# Patient Record
Sex: Female | Born: 1980 | Race: White | Hispanic: No | Marital: Married | Smoking: Never smoker
Health system: Southern US, Community
[De-identification: ages and names within clinical notes are randomized; demographics above are authoritative.]

## PROBLEM LIST (undated history)

## (undated) DIAGNOSIS — B999 Unspecified infectious disease: Secondary | ICD-10-CM

## (undated) DIAGNOSIS — D698 Other specified hemorrhagic conditions: Secondary | ICD-10-CM

## (undated) DIAGNOSIS — L509 Urticaria, unspecified: Secondary | ICD-10-CM

## (undated) DIAGNOSIS — L817 Pigmented purpuric dermatosis: Secondary | ICD-10-CM

## (undated) HISTORY — DX: Pigmented purpuric dermatosis: L81.7

## (undated) HISTORY — DX: Other specified hemorrhagic conditions: D69.8

## (undated) HISTORY — DX: Urticaria, unspecified: L50.9

## (undated) HISTORY — DX: Unspecified infectious disease: B99.9

---
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
## 2011-12-01 DIAGNOSIS — D698 Other specified hemorrhagic conditions: Secondary | ICD-10-CM

## 2011-12-01 DIAGNOSIS — L509 Urticaria, unspecified: Secondary | ICD-10-CM

## 2011-12-01 HISTORY — DX: Other specified hemorrhagic conditions: D69.8

## 2011-12-01 HISTORY — DX: Urticaria, unspecified: L50.9

## 2012-06-30 DIAGNOSIS — L817 Pigmented purpuric dermatosis: Secondary | ICD-10-CM

## 2012-06-30 HISTORY — DX: Pigmented purpuric dermatosis: L81.7

## 2012-11-30 NOTE — L&D Delivery Note (Signed)
Delivery Note At 12:00 PM a viable female was delivered via Vaginal, Spontaneous Delivery (Presentation: Left Occiput Anterior).  APGAR: 9, 9; weight pending.   Placenta status: Intact, Spontaneous.  Cord: 3 vessels with the following complications: None.  Cord pH: not collected  Anesthesia: Local  Episiotomy: None Lacerations: Periurethral;Vaginal Suture Repair: 3.0 vicryl 4.0 Est. Blood Loss (mL): 350 mL  Mom to postpartum.  Baby to skin to skin immediately after delivery.  Routine pp orders. Breastfeeding Pt c/o discomforts d/t hemorrhoids - medications ordered   Reed Eifert 07/23/2013, 1:54 PM

## 2013-01-05 ENCOUNTER — Ambulatory Visit: Payer: BC Managed Care – PPO | Admitting: Obstetrics and Gynecology

## 2013-01-05 DIAGNOSIS — Z331 Pregnant state, incidental: Secondary | ICD-10-CM

## 2013-01-05 LAB — POCT URINALYSIS DIPSTICK
Bilirubin, UA: NEGATIVE
Ketones, UA: NEGATIVE

## 2013-01-05 NOTE — Progress Notes (Signed)
NOB interview.  Transferring from [facility] without records which were requested today. States EDC by U/S 07/30/13. States had STD cultures and Pap last week but no blood work. 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 Is taking additional folic acid. NOB W/U with JO 01/06/13.

## 2013-01-06 ENCOUNTER — Ambulatory Visit: Payer: BC Managed Care – PPO

## 2013-01-06 ENCOUNTER — Ambulatory Visit: Payer: BC Managed Care – PPO | Admitting: Certified Nurse Midwife

## 2013-01-06 VITALS — BP 98/58 | Ht 64.0 in | Wt 113.0 lb

## 2013-01-06 DIAGNOSIS — O26849 Uterine size-date discrepancy, unspecified trimester: Secondary | ICD-10-CM

## 2013-01-06 DIAGNOSIS — E7212 Methylenetetrahydrofolate reductase deficiency: Secondary | ICD-10-CM | POA: Insufficient documentation

## 2013-01-06 DIAGNOSIS — Z331 Pregnant state, incidental: Secondary | ICD-10-CM

## 2013-01-06 DIAGNOSIS — L817 Pigmented purpuric dermatosis: Secondary | ICD-10-CM

## 2013-01-06 DIAGNOSIS — Z1589 Genetic susceptibility to other disease: Secondary | ICD-10-CM | POA: Insufficient documentation

## 2013-01-06 LAB — PRENATAL PANEL VII
HIV: NONREACTIVE
Hemoglobin: 13.4 g/dL (ref 12.0–15.0)
Hepatitis B Surface Ag: NEGATIVE
Lymphs Abs: 1.9 10*3/uL (ref 0.7–4.0)
MCH: 31.8 pg (ref 26.0–34.0)
Monocytes Relative: 7 % (ref 3–12)
Neutro Abs: 7.3 10*3/uL (ref 1.7–7.7)
Neutrophils Relative %: 74 % (ref 43–77)
RBC: 4.22 MIL/uL (ref 3.87–5.11)
Rubella: 0.89 Index (ref <0.90)

## 2013-01-06 NOTE — Patient Instructions (Signed)
Exercise During Pregnancy It is possible to maintain a healthy exercise program throughout a pregnancy. However, it is important to discuss exercising with your caregiver, so that the two of you may develop an appropriate exercise program. It is important to remember that exercise during pregnancy should be use to maintain one's health and not to lose weight. Strenuous activities should be avoided as the may cause the baby to have difficulty obtaining proper amounts of oxygen. A proper pregnancy exercise plan has many benefits including preparing you for the physical challenges of childbirth by strengthening the muscles that help with childbirth, reducing common backaches, alleviating constipation, improving posture, elevating mood, and promoting better sleep. If possible, begin exercising regularly before you become pregnant and continue through the duration of the pregnancy. It is difficult for a woman to begin an exercise program later in a pregnancy due to enlargement of the uterus and breasts and a shift in the center of gravity. Pregnancy exercise programs should be aimed at improving the muscles of the heart, back, pelvis, and abdomen. GENERAL GUIDELINES  Every woman and pregnancy is different; thus, the level of exercise you can do depends on your health, the conditions of the pregnancy, and activity level before pregnancy. For women who were sedentary before pregnancy, walking is a good way to begin. Use caution while participation in sports during pregnancy, because your center of gravity changes and may affect your balance or that require rapid movements. Always make sure to drink plenty of fluids to avoid dehydration, which may decrease blood flow to your baby. Avoid any activity that has the potential for trauma to the abdomen. If possible try to avoid high-altitude activities, which can deprive you and your baby of oxygen; this may cause premature labor. Talk with your caregiver.  Performing a  proper warm up and cool down are very important. It is important to start slowly and build up to more demanding exercises. Toward the end of an exercise session, gradually slow your activity. Perhaps try and work back through the exercises in reverse order. Check your pulse during peak activity, and discuss with your caregiver an appropriate range of heart rate for activity. Slow down your activity if your heart starts beating faster than the target range recommended by your health care provider. Do not exceed a heart rate of 140 beats per minute. Exercise that is too strenuous may speed up the baby's heartbeat to a dangerous level. In general, if you are able to carry on a conversation comfortably while exercising, your heart rate is probably within the recommended limits. Check to make sure.  You should stop exercising and call your health care provider if you have any unusual symptoms, such as pain, uterine contractions, chest pain, bleeding or fluid leakage from the vagina, dizziness, or shortness of breath. Talk to your health caregiver if you have any questions.  Document Released: 11/16/2005 Document Revised: 02/08/2012 Document Reviewed: 02/28/2009 [facility] Patient Information 2013 [place], [place].

## 2013-01-06 NOTE — Progress Notes (Signed)
°   [name] [name] is being seen today for her first obstetrical visit at [redacted]w[redacted]d gestation by sure LMP.  At her NOB with Nestor Ramp FH was not c/w dates, the [place] which showed a discrepancy, changing her GA by 22 days giving an EDD of 07/30/13  She reports feeling well.  She had questions regarding the supplements she has been taking particularly Vit A, about her requirements for folic acid, and exercise in pregnancy.  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  The following portions of the patient's history were reviewed and updated as appropriate: allergies, current medications, past family history, past medical history, past social history, past surgical history and problem list.  Review of Systems Pertinent ROS is described in HPI   Objective:   BP 98/58   Ht 5\' 4"  (1.626 m)   Wt 113 lb (51.256 kg)   BMI 19.40 kg/m2   LMP 10/01/2012 Wt Readings from Last 1 Encounters:  01/06/13 113 lb (51.256 kg)   BMI: Body mass index is 19.40 kg/(m^2).  General: alert, cooperative and no distress HEENT: grossly normal  Thyroid: normal  Respiratory: clear to auscultation bilaterally Cardiovascular: regular rate and rhythm,  Breasts:  No dominant masses, nipples erect Gastrointestinal: soft, non-tender; no masses,  no organomegaly Extremities: extremities normal, no pain or edema Vaginal Bleeding: None  Pelvic deferred -- done at [facility], awaiting records  FHR:  160  bpm  [place] SIUP, anteverted uterus, posterior placenta GA [redacted]w[redacted]d   Assessment:    Pregnancy at  [redacted]w[redacted]d MTHFR mutation A1298C  Plan:   EDD changed to 07/08/13 after dating [place]   Prenatal panel reviewed and discussed with the patient:  yes  Pap smear collected:  Collected at [facility] GC/Chlamydia collected:  no Wet prep:  Not done Discussion of Genetic testing options: Desires 1st trimester screening Prenatal vitamins recommended  Awaiting prenatal records from [facility] of care: Next visit:  4 weeks for ROB  Other anticipated f/u:      Haroldine Laws, CNM, MSN

## 2013-01-06 NOTE — Progress Notes (Signed)
Pt is here today for her NOB work-up. Pt stated she has cultures and pap done last week at [facility]. Pt stated no issues today.

## 2013-01-10 ENCOUNTER — Other Ambulatory Visit: Payer: Self-pay | Admitting: Obstetrics and Gynecology

## 2013-01-10 ENCOUNTER — Telehealth: Payer: Self-pay | Admitting: Obstetrics and Gynecology

## 2013-01-10 DIAGNOSIS — Q999 Chromosomal abnormality, unspecified: Secondary | ICD-10-CM

## 2013-01-10 LAB — US OB COMP LESS 14 WKS

## 2013-01-10 NOTE — Telephone Encounter (Signed)
Message copied by Mason Jim on Tue Jan 10, 2013  9:49 AM ------      Message from: Osborn Coho      Created: Tue Jan 10, 2013  7:37 AM      Regarding: RE: NOB        I don't recall needing additional folic acid for MTHFR mutation but 4mg  qd would be fine and sufficient for neural tube defects.  She may need anticoag prophylaxis though.  I rec a referral to MFM for consultation.  I am unaware of her history but a cursory look does not list a h/o sab or clotting event.            To the nurses,      Please refer to MFM            Thank you,      AYR      ----- Message -----         From: Haroldine Laws, CNM         Sent: 01/06/2013   5:35 PM           To: Purcell Nails, MD      Subject: NOB                                                      Dr. Su Hilt,            I saw a NOB today with MTHFR mutation A1298C.  Just from what the pt said, I understand this effects how much folic acid she needs in her pregnancy, but she wasn't sure how much.            And is there anything else I need to consider?            Thank you!      Candise Bowens            FYI: her records from [facility] haven't arrived yet so there isn't much in her file yet.       ------

## 2013-01-10 NOTE — Telephone Encounter (Signed)
TC to pt. LM to return call. To inform of MFM referral.

## 2013-01-10 NOTE — Telephone Encounter (Signed)
TC to pt. Informed of MFM appt 01/17/13 at 11:00. Also informed may take total of 4 mg Folic Acid. Pt verbalizes comprehension.

## 2013-01-16 ENCOUNTER — Other Ambulatory Visit: Payer: Self-pay

## 2013-01-16 DIAGNOSIS — Z36 Encounter for antenatal screening of mother: Secondary | ICD-10-CM

## 2013-01-16 NOTE — Patient Instructions (Signed)
°

## 2013-01-17 ENCOUNTER — Ambulatory Visit (HOSPITAL_COMMUNITY)
Admission: RE | Admit: 2013-01-17 | Discharge: 2013-01-17 | Disposition: A | Discharge status: Home / Self Care | Payer: BC Managed Care – PPO | Source: Ambulatory Visit | Attending: Obstetrics and Gynecology | Admitting: Obstetrics and Gynecology

## 2013-01-17 ENCOUNTER — Ambulatory Visit: Payer: BC Managed Care – PPO

## 2013-01-17 ENCOUNTER — Other Ambulatory Visit: Payer: Self-pay | Admitting: Obstetrics and Gynecology

## 2013-01-17 ENCOUNTER — Encounter (HOSPITAL_COMMUNITY): Payer: Self-pay

## 2013-01-17 DIAGNOSIS — Z36 Encounter for antenatal screening of mother: Secondary | ICD-10-CM

## 2013-01-17 DIAGNOSIS — L989 Disorder of the skin and subcutaneous tissue, unspecified: Secondary | ICD-10-CM | POA: Insufficient documentation

## 2013-01-17 DIAGNOSIS — E7212 Methylenetetrahydrofolate reductase deficiency: Secondary | ICD-10-CM

## 2013-01-17 DIAGNOSIS — E7210 Disorders of sulfur-bearing amino-acid metabolism, unspecified: Secondary | ICD-10-CM | POA: Insufficient documentation

## 2013-01-17 DIAGNOSIS — O99891 Other specified diseases and conditions complicating pregnancy: Secondary | ICD-10-CM | POA: Insufficient documentation

## 2013-01-17 DIAGNOSIS — Z1589 Genetic susceptibility to other disease: Secondary | ICD-10-CM

## 2013-01-17 LAB — US OB COMP LESS 14 WKS

## 2013-01-17 NOTE — Consult Note (Signed)
Maternal Fetal Medicine Consultation  Requesting Provider(s): Dierdre Forth, MD  Reason for consultation: Heterozygous for MTHFR A1298C  HPI: [name] [name] is a 32 yo G1P0 currently at 46 4/7 weeks who was seen today due to MTHFH polymorphism (A1298C).  [name] reports that over the last 6-12 months, she has experienced frequent lower extremity skin rashes - thought to be a possible vasculitis.  Her lower extremities frequently erupted into reddish vascular plaques and resolved after several days.  She has been seen my multiple specialists (dermatology, rheumatology) both here and in [place] where she previously resided that so far have been unrevealing.  A recent skin biopsy showed possible "hives".  As part of her work up, she underwent she had testing for MTHFR polymorphisms that showed she was heterozygous for A1298C.    [name] denies any personal history of venous thromboembolism.  She reports that her half-sister had "blood clots" but could not give any further details - unsure whether or not she required blood thinners.  Since becoming pregnant, her skin lesions have improved.  Her pregnancy course thus far has been uncomplicated.  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  Maternal grandmother with dementia, breast cancer  Soc: denies tobacco, ETOH use during pregnancy, denies illicit drug use  Review of Systems: no vaginal bleeding or cramping/contractions, no LOF, no nausea/vomiting. All other systems  reviewed and are negative.   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  Since the lesions are felt to be due to a possible vasculitis / rheumatologic abnormality, it may be reasonable to check antiphospholipid antibodies (anticardiolipin, lupus anticoagulant and anti beta2 microglobulin).  Would also recommend a screening ultrasound at approximately 28-30 weeks for growth.  If growth is appropriate at that time, would not recommend additional testing.         3) MTHFR polymorphism (A1298C) - accumulating evidence seems to show no difference in risk for disease in people in the [place] with and without the C667T and A1298C polymorphisms. In fact, the A1298C does not appear to affect the gene function at all.  At present, hyperhomocystenemia and MTHFR screening are not recommended as part of the thrombophilia work up, particularly in patients with a history of venous thromboembolism.  With a normal diet, there is likely no benefit from additional folate or vit B6 supplementation.  Thank you for the opportunity to be a part of the care of [name] [name]. Please contact our office if we can be of further assistance.   I spent approximately 30 minutes with this patient with over 50% of time spent in face-to-face counseling.  Alpha Gula, MD Maternal Fetal Medicine

## 2013-01-18 NOTE — Addendum Note (Signed)
Encounter addended by: Ty Hilts, RN on: 01/18/2013  3:34 PM<BR>     Documentation filed: Charges VN

## 2013-02-02 ENCOUNTER — Encounter: Payer: BC Managed Care – PPO | Admitting: Certified Nurse Midwife

## 2013-02-03 ENCOUNTER — Ambulatory Visit: Payer: BC Managed Care – PPO | Admitting: Obstetrics and Gynecology

## 2013-02-03 ENCOUNTER — Encounter: Payer: Self-pay | Admitting: Obstetrics and Gynecology

## 2013-02-03 VITALS — BP 98/60 | Wt 118.0 lb

## 2013-02-03 DIAGNOSIS — Z87440 Personal history of urinary (tract) infections: Secondary | ICD-10-CM | POA: Insufficient documentation

## 2013-02-03 DIAGNOSIS — Z331 Pregnant state, incidental: Secondary | ICD-10-CM

## 2013-02-03 DIAGNOSIS — I776 Arteritis, unspecified: Secondary | ICD-10-CM | POA: Insufficient documentation

## 2013-02-03 LAB — POCT URINALYSIS DIPSTICK
Bilirubin, UA: NEGATIVE
Ketones, UA: NEGATIVE
Spec Grav, UA: 1.01
pH, UA: 8

## 2013-02-03 NOTE — Addendum Note (Signed)
Addended by: Lerry Liner D on: 02/03/2013 03:16 PM   Modules accepted: Orders

## 2013-02-03 NOTE — Progress Notes (Signed)
[redacted]w[redacted]d No complaints Reviewed NOB labs done here, those done at [facility], AND 1ST TRIMESTER SCREEN AFP today Chem 10 today shows 1+ leuks.  Will do C&S and monthly for recurrent UTIs.   Reviewed MFM visit with Dr. Claudean Severance who recommends: No rx for MTHFR mutatuion         ACLA, LAC, antiB2 microglobulin workup for vasculitis F/u 4 weeks for ROB and U/S

## 2013-02-04 LAB — CULTURE, OB URINE
Colony Count: NO GROWTH
Organism ID, Bacteria: NO GROWTH

## 2013-02-06 LAB — BETA 2 MICROGLOBULIN, SERUM: Beta-2 Microglobulin: 1.04 mg/L (ref 1.01–1.73)

## 2013-02-07 LAB — ALPHA FETOPROTEIN, MATERNAL: Curr Gest Age: 15 wks.days

## 2013-02-07 LAB — LUPUS ANTICOAGULANT PANEL
DRVVT: 26.2 secs (ref <42.9)
PTT Lupus Anticoagulant: 27.9 secs — ABNORMAL LOW (ref 28.0–43.0)

## 2013-02-07 LAB — CARDIOLIPIN ANTIBODY: Phospholipids: 227 mg/dL (ref 151–264)

## 2013-07-23 ENCOUNTER — Encounter (HOSPITAL_COMMUNITY): Payer: Self-pay | Admitting: *Deleted

## 2013-07-23 ENCOUNTER — Inpatient Hospital Stay (HOSPITAL_COMMUNITY)
Admission: AD | Admit: 2013-07-23 | Discharge: 2013-07-25 | DRG: 775 | Disposition: A | Discharge status: Home / Self Care | Payer: Medicaid Other | Source: Ambulatory Visit | Attending: Obstetrics and Gynecology | Admitting: Obstetrics and Gynecology

## 2013-07-23 DIAGNOSIS — IMO0001 Reserved for inherently not codable concepts without codable children: Secondary | ICD-10-CM

## 2013-07-23 DIAGNOSIS — D649 Anemia, unspecified: Secondary | ICD-10-CM | POA: Diagnosis not present

## 2013-07-23 DIAGNOSIS — E7210 Disorders of sulfur-bearing amino-acid metabolism, unspecified: Secondary | ICD-10-CM | POA: Diagnosis present

## 2013-07-23 DIAGNOSIS — I776 Arteritis, unspecified: Secondary | ICD-10-CM | POA: Diagnosis present

## 2013-07-23 DIAGNOSIS — O878 Other venous complications in the puerperium: Secondary | ICD-10-CM | POA: Diagnosis present

## 2013-07-23 DIAGNOSIS — K649 Unspecified hemorrhoids: Secondary | ICD-10-CM | POA: Diagnosis present

## 2013-07-23 MED ORDER — ONDANSETRON HCL 4 MG/2ML IJ SOLN: 4.0000 mg | INTRAMUSCULAR | Status: DC | PRN

## 2013-07-23 MED ORDER — BUTORPHANOL TARTRATE 1 MG/ML IJ SOLN: 1.0000 mg | INTRAMUSCULAR | Status: DC | PRN

## 2013-07-23 MED ORDER — BENZOCAINE-MENTHOL 20-0.5 % EX AERO
1.0000 "application " | INHALATION_SPRAY | CUTANEOUS | Status: DC | PRN
  Administered 2013-07-23: 1 via TOPICAL
  Filled 2013-07-23 (×2): qty 56

## 2013-07-23 MED ORDER — IBUPROFEN 600 MG PO TABS
600.0000 mg | ORAL_TABLET | Freq: Four times a day (QID) | ORAL | Status: DC
  Administered 2013-07-23 – 2013-07-25 (×8): 600 mg via ORAL
  Filled 2013-07-23 (×8): qty 1

## 2013-07-23 MED ORDER — DIPHENHYDRAMINE HCL 25 MG PO CAPS: 25.0000 mg | ORAL_CAPSULE | Freq: Four times a day (QID) | ORAL | Status: DC | PRN

## 2013-07-23 MED ORDER — ONDANSETRON HCL 4 MG/2ML IJ SOLN: 4.0000 mg | Freq: Four times a day (QID) | INTRAMUSCULAR | Status: DC | PRN

## 2013-07-23 MED ORDER — CITRIC ACID-SODIUM CITRATE 334-500 MG/5ML PO SOLN: 30.0000 mL | ORAL | Status: DC | PRN

## 2013-07-23 MED ORDER — ONDANSETRON HCL 4 MG PO TABS: 4.0000 mg | ORAL_TABLET | ORAL | Status: DC | PRN

## 2013-07-23 MED ORDER — OXYTOCIN BOLUS FROM INFUSION: 500.0000 mL | INTRAVENOUS | Status: DC

## 2013-07-23 MED ORDER — TETANUS-DIPHTH-ACELL PERTUSSIS 5-2.5-18.5 LF-MCG/0.5 IM SUSP: 0.5000 mL | Freq: Once | INTRAMUSCULAR | Status: DC

## 2013-07-23 MED ORDER — OXYTOCIN 10 UNIT/ML IJ SOLN
INTRAMUSCULAR | Status: AC
  Filled 2013-07-23: qty 1

## 2013-07-23 MED ORDER — DIBUCAINE 1 % RE OINT
1.0000 "application " | TOPICAL_OINTMENT | RECTAL | Status: DC | PRN
  Administered 2013-07-23: 1 via RECTAL
  Filled 2013-07-23: qty 28

## 2013-07-23 MED ORDER — ZOLPIDEM TARTRATE 5 MG PO TABS: 5.0000 mg | ORAL_TABLET | Freq: Every evening | ORAL | Status: DC | PRN

## 2013-07-23 MED ORDER — LIDOCAINE HCL (PF) 1 % IJ SOLN
30.0000 mL | INTRAMUSCULAR | Status: DC | PRN
  Filled 2013-07-23 (×2): qty 30

## 2013-07-23 MED ORDER — IBUPROFEN 600 MG PO TABS: 600.0000 mg | ORAL_TABLET | Freq: Four times a day (QID) | ORAL | Status: DC | PRN

## 2013-07-23 MED ORDER — OXYCODONE-ACETAMINOPHEN 5-325 MG PO TABS
1.0000 | ORAL_TABLET | ORAL | Status: DC | PRN
  Administered 2013-07-24 (×5): 1 via ORAL
  Administered 2013-07-25 (×2): 2 via ORAL
  Administered 2013-07-25: 1 via ORAL
  Filled 2013-07-23 (×4): qty 1
  Filled 2013-07-23: qty 2
  Filled 2013-07-23 (×4): qty 1

## 2013-07-23 MED ORDER — SENNOSIDES-DOCUSATE SODIUM 8.6-50 MG PO TABS
2.0000 | ORAL_TABLET | Freq: Every day | ORAL | Status: DC
  Administered 2013-07-24: 2 via ORAL

## 2013-07-23 MED ORDER — OXYCODONE-ACETAMINOPHEN 5-325 MG PO TABS: 1.0000 | ORAL_TABLET | ORAL | Status: DC | PRN

## 2013-07-23 MED ORDER — LANOLIN HYDROUS EX OINT: TOPICAL_OINTMENT | CUTANEOUS | Status: DC | PRN

## 2013-07-23 MED ORDER — SIMETHICONE 80 MG PO CHEW: 80.0000 mg | CHEWABLE_TABLET | ORAL | Status: DC | PRN

## 2013-07-23 MED ORDER — LACTATED RINGERS IV SOLN: 500.0000 mL | INTRAVENOUS | Status: DC | PRN

## 2013-07-23 MED ORDER — OXYTOCIN 40 UNITS IN LACTATED RINGERS INFUSION - SIMPLE MED: 62.5000 mL/h | INTRAVENOUS | Status: DC

## 2013-07-23 MED ORDER — FLEET ENEMA 7-19 GM/118ML RE ENEM: 1.0000 | ENEMA | RECTAL | Status: DC | PRN

## 2013-07-23 MED ORDER — WITCH HAZEL-GLYCERIN EX PADS
1.0000 "application " | MEDICATED_PAD | CUTANEOUS | Status: DC | PRN
  Administered 2013-07-23: 1 via TOPICAL

## 2013-07-23 MED ORDER — PRENATAL MULTIVITAMIN CH
1.0000 | ORAL_TABLET | Freq: Every day | ORAL | Status: DC
  Administered 2013-07-24 – 2013-07-25 (×2): 1 via ORAL
  Filled 2013-07-23 (×2): qty 1

## 2013-07-23 MED ORDER — ACETAMINOPHEN 325 MG PO TABS: 650.0000 mg | ORAL_TABLET | ORAL | Status: DC | PRN

## 2013-07-23 NOTE — Progress Notes (Signed)
°  Subjective: CNM introducted to pt.  Pt in water, coping well with UCs.  Objective: BP 128/85   Pulse 91   Temp(Src) 97.9 F (36.6 C) (Oral)   Resp 20   Ht 5\' 2"  (1.575 m)   Wt 131 lb (59.421 kg)   BMI 23.95 kg/m2   SpO2 100%   LMP 10/01/2012      FHT:  FHR: 145 bpm, variability: moderate,  accelerations:  Present,  decelerations:  Absent UC:   regular, every 3 minutes SVE:   Deferred  Assessment / Plan:  Labor: Active labor Preeclampsia: No s/s Fetal Wellbeing: IA  Pain Control: Water birth, breathing and relaxation I/D: GBS neg; Intact Anticipated MOD: SVD   Annalicia Renfrew 07/23/2013, 8:18 AM

## 2013-07-23 NOTE — Progress Notes (Signed)
°  Subjective: Pt in tub having to work a little harder to manage UCs..  Husband, Doula, and photographer at [facility].    Objective: BP 128/85   Pulse 91   Temp(Src) 97.9 F (36.6 C) (Oral)   Resp 20   Ht 5\' 2"  (1.575 m)   Wt 131 lb (59.421 kg)   BMI 23.95 kg/m2   SpO2 100%   LMP 10/01/2012      FHT: IA: BL 155 with moderate variability  UC:   regular, every 2 minutes  SVE:   Dilation: 9 Effacement (%): 100 Station: +2 Exam by:: m wilkins rnc  Assessment / Plan:  Labor: Active labor  Preeclampsia: No s/s  Fetal Wellbeing: IA  Pain Control: Water birth, breathing and relaxation  I/D: GBS neg; SROM @ 1000 Anticipated MOD: SVD   Kimbria Camposano 07/23/2013, 10:11 AM

## 2013-07-23 NOTE — MAU Note (Signed)
Contractions that started around 11:30pm

## 2013-07-23 NOTE — Progress Notes (Signed)
°  Subjective: Pt in bathroom and would like to be checked.  Back to water after check.  Pt managing well through UCs with breathing and relaxation.  Doula, Environmental manager, and husband at [facility].  Objective: BP 128/85   Pulse 91   Temp(Src) 97.9 F (36.6 C) (Oral)   Resp 20   Ht 5\' 2"  (1.575 m)   Wt 131 lb (59.421 kg)   BMI 23.95 kg/m2   SpO2 100%   LMP 10/01/2012      FHT:  FHR: 150 bpm, variability: moderate,  accelerations:  Present,  decelerations:  Absent UC:   regular, every 3 minutes  SVE:   Dilation: 8 Effacement (%): 100 Station: +1 Exam by:: Yeilyn Gent  Assessment / Plan:  Labor: Active labor  Preeclampsia: No s/s  Fetal Wellbeing: IA  Pain Control: Water birth, breathing and relaxation  I/D: GBS neg; Intact  Anticipated MOD: SVD   [name] [name] 07/23/2013, 8:27 AM

## 2013-07-23 NOTE — H&P (Signed)
[name] [name] is a 32 y.o. female presenting at 10w 2d gesttation for onset of regular painful contractions with increased intensity and frequency since 12:00 AM 07/23/13.  Denies ROM or bldg.  Reports active fetus.  Unable to walk to talk through contractions.  Pt desires waterbirth and has taken the appropriate classes.  Pt is accompanied by her husband and doula.     History History of Present Pregnancy:  Pt entered care in the 1st trimester at [facility].  1st trimester screening was negative.  Pt transferred care to [facility] at 13w 5d.  Pt was evaluated by MFM due to her history of vasculitis/Schambergs Dx/MTHFR Heterozygous A1298C.  No treatment was recommended however it was recommended pt have a lupus anticoagulant, Beta 2 glycoprotein and anticardiolipin antibodies drawn.  The lupus anticoagulant was negative as was the Beta 2 glycoprotein.  No record of anticardiolipin antibody results available in the prenatal record.  Ultrasound for anatomy was performed at 18w 4d with fetal growth at the 52nd%ile.  At that time a quad screen was also drawn inadvertently.  The quad screen returned with increased risk of Trisomy 21 of 1:51, otherwise no increased risk for NTD or Trisomy 18.  Pt declined NIPT.  Due to abnormal quad screen, repeat ultrasound obtained at 28wks for fetal growth with EFW at the 49th% and normal amniotic fluid volumes.  Cervical length was also normal at that time.  One hour GTT returned with normal results at 28wks.  Repeat ultrasound was obtained at 31w 5d for growth with EFW at the 95th%ile and normal AFI.  Due to suspected LGA fetus at previous ultrasound, repeat ultrasound was obtained at 36w 5d with EFW at the 73.7rd%ile/7# 1oz and normal amniotic fluid volume.  The remainder of the pt's prenatal course has been uneventful.  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 Social History:  reports that she has never smoked. She has never used smokeless tobacco. She reports that she drinks about 0.5 ounces of alcohol per week. She reports that she does not use illicit drugs.   Prenatal Transfer Tool  Maternal Diabetes: No Genetic Screening: Abnormal:  Results: Elevated risk of Trisomy 21.  Had neg 1st trimester screen prior to quad. Maternal Ultrasounds/Referrals: Normal Fetal Ultrasounds or other Referrals:  Referred to Materal Fetal Medicine due to mat hx vasculitis/Schamberg Dx Maternal Substance Abuse:  No Significant Maternal Medications:  None Significant Maternal Lab Results:  Lab values include: Other: Quad screen with inc risk Tri 21 1:51 Other Comments:  None  Review of Systems  Constitutional: Negative.   HENT: Negative.   Eyes: Negative.   Respiratory: Negative.   Cardiovascular: Negative.   Gastrointestinal: Negative.   Genitourinary: Negative.  Musculoskeletal: Negative.   Skin: Negative.   Neurological: Negative.   Endo/Heme/Allergies: Negative.   Psychiatric/Behavioral: Negative.     Dilation: 6.5 Effacement (%): 100 Station: 0 Exam by:: Conni Elliot CNM Blood pressure 119/83, pulse 90, temperature 98.5 F (36.9 C), temperature source Oral, resp. rate 20, height 5\' 2"  (1.575 m), weight 59.421 kg (131 lb), last menstrual period 10/01/2012,  SpO2 100.00%. Maternal Exam:  Uterine Assessment: Contraction strength is firm.  Contraction frequency is regular.   Abdomen: Fundal height is 38.   Estimated fetal weight is 7#.   Fetal presentation: vertex  Introitus: Normal vulva. Normal vagina.  Ferning test: not done.  Nitrazine test: not done. Amniotic fluid character: not assessed.  Pelvis: adequate for delivery.   Cervix: Cervix evaluated by digital exam.     Fetal Exam Fetal Monitor Review: Mode: ultrasound.   Baseline rate: 140.  Variability: moderate (6-25 bpm).   Pattern: accelerations present and no decelerations.    Fetal State Assessment: Category I - tracings are normal.     Physical Exam  Constitutional: She is oriented to person, place, and time. She appears well-developed and well-nourished.  HENT:  Head: Normocephalic and atraumatic.  Right Ear: External ear normal.  Left Ear: External ear normal.  Nose: Nose normal.  Eyes: Conjunctivae are normal. Pupils are equal, round, and reactive to light.  Neck: Normal range of motion. Neck supple.  Cardiovascular: Normal rate, regular rhythm, normal heart sounds and intact distal pulses.   Respiratory: Effort normal and breath sounds normal.  GI: Soft. Bowel sounds are normal. There is no tenderness. There is no rebound and no guarding.  Genitourinary: Vagina normal and uterus normal.  Musculoskeletal: Normal range of motion.  Neurological: She is alert and oriented to person, place, and time. She has normal reflexes.  Skin: Skin is warm and dry.  Psychiatric: She has a normal mood and affect. Her behavior is normal.    Prenatal labs: ABO, Rh: O/POS/-- (02/06 1421) Antibody: NEG (02/06 1421) Rubella: 0.89 (02/06 1421) RPR: NON REAC (02/06 1421)  HBsAg: NEGATIVE (02/06 1421)  HIV: NON REACTIVE (02/06 1421)  GBS: Negative (02/06 0000)   Assessment/Plan: IUP at 39w 2d Active labor Hx vasculitis Inc risk Trisomy 21 on quad screen   Admit to birthing  suites per consult with Dr. Dion Body. Routine admission orders. Pt declines IV access at this time unless absolutely needed. Pt desires minimal intervention and waterbirth.  RBA waterbirth d/w pt and consent signed.  D/W pt OK for intermittent fetal monitoring at the present time.    Loreal Schuessler O. 07/23/2013, 5:29 AM

## 2013-07-24 DIAGNOSIS — D649 Anemia, unspecified: Secondary | ICD-10-CM | POA: Diagnosis not present

## 2013-07-24 LAB — CBC
HCT: 35.2 % — ABNORMAL LOW (ref 36.0–46.0)
MCV: 91.9 fL (ref 78.0–100.0)
Platelets: 164 10*3/uL (ref 150–400)
RBC: 3.83 MIL/uL — ABNORMAL LOW (ref 3.87–5.11)
RDW: 14.2 % (ref 11.5–15.5)
WBC: 14.3 10*3/uL — ABNORMAL HIGH (ref 4.0–10.5)

## 2013-07-24 MED ORDER — HYDROCORTISONE ACE-PRAMOXINE 1-1 % RE FOAM
1.0000 | Freq: Two times a day (BID) | RECTAL | Status: DC
  Administered 2013-07-24 – 2013-07-25 (×2): 1 via RECTAL
  Filled 2013-07-24 (×2): qty 10

## 2013-07-24 NOTE — Lactation Note (Signed)
This note was copied from the chart of Girl Sayge Brienza. Lactation Consultation Note:Follow up visit with mom. She reports that baby has been sleepy and would not latch. Baby waking assisted mom with putting the baby to the breast. Baby fussy and would only take a few sucks. Used #20 NS and baby continues to fuss. Dad holding baby while mom going to bathroom. DEBP set up with instructions for use and cleaning.  Patient Name: Girl Marsella Suman [id number] Date: 07/24/2013 Reason for consult: Follow-up assessment   Maternal Data    Feeding Feeding Type: Breast Milk Length of feed: 2 min  LATCH Score/Interventions Latch: Too sleepy or reluctant, no latch achieved, no sucking elicited.  Audible Swallowing: None  Type of Nipple: Flat  Comfort (Breast/Nipple): Soft / non-tender     Hold (Positioning): Assistance needed to correctly position infant at breast and maintain latch. Intervention(s): Breastfeeding basics reviewed;Support Pillows;Position options;Skin to skin  LATCH Score: 4  Lactation Tools Discussed/Used     Consult Status Consult Status: Follow-up Date: 07/24/13 Follow-up type: In-patient    Pamelia Hoit 07/24/2013, 12:02 PM

## 2013-07-24 NOTE — Lactation Note (Signed)
This note was copied from the chart of [name] [name]. Lactation Consultation Note: assisted mom with latch with NS. Baby nursed for 10 minutes Colostrum noted in NS. Encouraged to pump after nursing to promote a good milk supply. No questions at present. To call prn  Patient Name: [name] [name] [id number] Date: 07/24/2013 Reason for consult: Follow-up assessment   Maternal Data    Feeding Feeding Type: Breast Milk Length of feed: 10 min  LATCH Score/Interventions Latch: Grasps breast easily, tongue down, lips flanged, rhythmical sucking. (with NS)  Audible Swallowing: None  Type of Nipple: Flat  Comfort (Breast/Nipple): Soft / non-tender     Hold (Positioning): Assistance needed to correctly position infant at breast and maintain latch. Intervention(s): Breastfeeding basics reviewed;Support Pillows;Position options;Skin to skin  LATCH Score: 6  Lactation Tools Discussed/Used Tools: Nipple Shields Nipple shield size: 20 Breast pump type: Double-Electric Breast Pump Pump Review: Setup, frequency, and cleaning Initiated by:: DW Date initiated:: 07/24/13   Consult Status Consult Status: Follow-up Date: 07/25/13 Follow-up type: In-patient    Pamelia Hoit 07/24/2013, 12:37 PM

## 2013-07-24 NOTE — Progress Notes (Signed)
Post Partum Day 1: S/P SVD with a 2nd degree laceration (Water Birth)  Subjective: Patient up ad lib, denies syncope or dizziness. Feeding:  Breastfeeding Contraceptive plan:   Unknown at this time  Objective: Blood pressure 94/59, pulse 77, temperature 98 F (36.7 C), temperature source Oral, resp. rate 20, height 5\' 2"  (1.575 m), weight 131 lb (59.421 kg), last menstrual period 10/01/2012, SpO2 100.00%, unknown if currently breastfeeding.  Physical Exam:  General: alert, cooperative and no distress Lochia: appropriate Uterine Fundus: firm Incision: healing well DVT Evaluation: No evidence of DVT seen on physical exam. Negative Homan's sign.   Recent Labs  07/24/13 0620  HGB 11.8*  HCT 35.2*    Assessment/Plan: S/P Vaginal delivery day 1 Continue current care Plan for discharge tomorrow   LOS: 1 day   [name] [name] 07/24/2013, 8:13 AM

## 2013-07-25 MED ORDER — HYDROCORTISONE ACE-PRAMOXINE 1-1 % RE FOAM: 1.0000 | Freq: Two times a day (BID) | RECTAL | Status: DC

## 2013-07-25 MED ORDER — OXYCODONE-ACETAMINOPHEN 5-325 MG PO TABS: 1.0000 | ORAL_TABLET | ORAL | Status: DC | PRN

## 2013-07-25 MED ORDER — SENNOSIDES-DOCUSATE SODIUM 8.6-50 MG PO TABS: 2.0000 | ORAL_TABLET | Freq: Two times a day (BID) | ORAL | Status: DC

## 2013-07-25 MED ORDER — IBUPROFEN 600 MG PO TABS: 600.0000 mg | ORAL_TABLET | Freq: Four times a day (QID) | ORAL | Status: DC

## 2013-07-25 MED ORDER — MEASLES, MUMPS & RUBELLA VAC ~~LOC~~ INJ
0.5000 mL | INJECTION | Freq: Once | SUBCUTANEOUS | Status: AC
  Administered 2013-07-25: 0.5 mL via SUBCUTANEOUS
  Filled 2013-07-25 (×2): qty 0.5

## 2013-07-25 NOTE — Discharge Summary (Signed)
°  Obstetric Discharge Summary  Reason for Admission: onset of labor Prenatal Procedures: none Intrapartum Procedures: spontaneous vaginal delivery  Waterbirth by Haroldine Laws CNM on 07/24/13  Girl named Sophia Reine Postpartum Procedures: none Complications-Operative and Postpartum: 2nd degree perineal laceration  Hemoglobin  Date Value Range Status  07/24/2013 11.8* 12.0 - 15.0 g/dL Final     HCT  Date Value Range Status  07/24/2013 35.2* 36.0 - 46.0 % Final    Discharge Diagnoses: Term Pregnancy-delivered  Discharge Information:  Date: 07/25/2013 Activity: unrestricted Diet: routine Medications: Ibuprofen, Colace, Percocet and Proctofoam Condition: stable  Breastfeeding: yes  Instructions: refer to practice specific booklet Discharge to: home   Newborn Data: Live born  [facility] with mother.  Marshun Duva A MD 07/25/2013, 11:18 AM

## 2013-07-25 NOTE — Discharge Instructions (Signed)

## 2013-07-25 NOTE — Lactation Note (Signed)
This note was copied from the chart of [name] [name]. Lactation Consultation Note: Follow  Up visit with mom. She reports that baby was nursing well but has not nursed since 1 30 am. Unwrapped baby. Ped. In to examine baby. Assisted mom with latch in football hold. Sophie nursed well for 25 minutes on the left breast with the nipple shield and was nursing on the right breast when I left the room. Reviewed watching for feeding cues and feeding whenever she sees them. No questions at present. To call prn. Reviewed OP appointments and BFSG as resources for support after DC.  Patient Name: [name] Eureka Valdes BMWUX'L Date: 07/25/2013 Reason for consult: Follow-up assessment   Maternal Data    Feeding Feeding Type: Breast Milk  LATCH Score/Interventions Latch: Grasps breast easily, tongue down, lips flanged, rhythmical sucking. (with NS)  Audible Swallowing: A few with stimulation  Type of Nipple: Flat  Comfort (Breast/Nipple): Soft / non-tender     Hold (Positioning): Assistance needed to correctly position infant at breast and maintain latch. Intervention(s): Breastfeeding basics reviewed;Support Pillows;Position options  LATCH Score: 7  Lactation Tools Discussed/Used Tools: Nipple Shields Nipple shield size: 20   Consult Status Consult Status: Complete    Pamelia Hoit 07/25/2013, 9:45 AM

## 2013-07-26 NOTE — Consent Form (Signed)
°

## 2013-07-26 NOTE — Patient Instructions (Signed)
°

## 2013-09-16 ENCOUNTER — Encounter (HOSPITAL_COMMUNITY): Payer: Self-pay | Admitting: *Deleted

## 2013-09-16 ENCOUNTER — Inpatient Hospital Stay (HOSPITAL_COMMUNITY)
Admission: AD | Admit: 2013-09-16 | Discharge: 2013-09-16 | Disposition: A | Discharge status: Home / Self Care | Payer: BC Managed Care – PPO | Source: Ambulatory Visit | Attending: Obstetrics and Gynecology | Admitting: Obstetrics and Gynecology

## 2013-09-16 NOTE — MAU Note (Signed)
Patient presents with complaint of excessive bleeding and clotting from a labial repair after delivery on 09/14/13.

## 2013-09-16 NOTE — MAU Note (Signed)
Pt presents with complaints of excessive vaginal bleeding from her vaginal repair, she delivered on August the 24th.

## 2013-09-16 NOTE — Progress Notes (Signed)
Patient arrival message left on voicemail.

## 2013-09-28 ENCOUNTER — Other Ambulatory Visit: Payer: Self-pay | Admitting: Oncology

## 2013-09-28 ENCOUNTER — Telehealth: Payer: Self-pay | Admitting: Oncology

## 2013-09-28 DIAGNOSIS — I776 Arteritis, unspecified: Secondary | ICD-10-CM

## 2013-09-28 NOTE — Telephone Encounter (Signed)
Pt scheduled per md 10/31 @ 8:15 w/Dr. Cyndie Chime

## 2013-09-29 ENCOUNTER — Ambulatory Visit (HOSPITAL_BASED_OUTPATIENT_CLINIC_OR_DEPARTMENT_OTHER): Payer: BC Managed Care – PPO | Admitting: Lab

## 2013-09-29 ENCOUNTER — Ambulatory Visit: Payer: BC Managed Care – PPO

## 2013-09-29 ENCOUNTER — Telehealth: Payer: Self-pay | Admitting: Oncology

## 2013-09-29 ENCOUNTER — Ambulatory Visit (HOSPITAL_BASED_OUTPATIENT_CLINIC_OR_DEPARTMENT_OTHER): Payer: BC Managed Care – PPO | Admitting: Oncology

## 2013-09-29 VITALS — BP 106/65 | HR 79 | Temp 96.9°F | Resp 18 | Ht 64.0 in | Wt 112.9 lb

## 2013-09-29 DIAGNOSIS — I776 Arteritis, unspecified: Secondary | ICD-10-CM

## 2013-09-29 LAB — COMPREHENSIVE METABOLIC PANEL (CC13)
ALT: 63 U/L — ABNORMAL HIGH (ref 0–55)
AST: 33 U/L (ref 5–34)
Alkaline Phosphatase: 116 U/L (ref 40–150)
Calcium: 10.3 mg/dL (ref 8.4–10.4)
Chloride: 105 mEq/L (ref 98–109)
Creatinine: 0.7 mg/dL (ref 0.6–1.1)
Glucose: 65 mg/dl — ABNORMAL LOW (ref 70–140)
Potassium: 4.3 mEq/L (ref 3.5–5.1)
Total Bilirubin: 0.32 mg/dL (ref 0.20–1.20)

## 2013-09-29 LAB — MORPHOLOGY: PLT EST: ADEQUATE

## 2013-09-29 LAB — LACTATE DEHYDROGENASE (CC13): LDH: 151 U/L (ref 125–245)

## 2013-09-29 LAB — CBC & DIFF AND RETIC
BASO%: 0.1 % (ref 0.0–2.0)
Basophils Absolute: 0 10*3/uL (ref 0.0–0.1)
Immature Retic Fract: 4.9 % (ref 1.60–10.00)
LYMPH%: 27 % (ref 14.0–49.7)
MCHC: 33.2 g/dL (ref 31.5–36.0)
MCV: 91.9 fL (ref 79.5–101.0)
MONO#: 0.5 10*3/uL (ref 0.1–0.9)
MONO%: 6.5 % (ref 0.0–14.0)
Platelets: 182 10*3/uL (ref 145–400)
RBC: 4.59 10*6/uL (ref 3.70–5.45)
Retic %: 1.05 % (ref 0.70–2.10)

## 2013-09-29 LAB — CHCC SMEAR

## 2013-09-29 NOTE — Telephone Encounter (Signed)
gv and printed appt sched and avs for pt for NOV °

## 2013-09-29 NOTE — Progress Notes (Signed)
New Patient Hematology-Oncology Evaluation   [name] [name] [id number] Jun 19, 1981 32 y.o. 09/29/2013  CC: Dr. Dois Davenport Rivard; Dr. Para Skeans   Reason for referral: Idiopathic vasculitic rash and poor wound healing   HPI New patient evaluation for this pleasant 32 year old woman whose medical history started back in May of 2010 while she was living in [place]. She developed acute onset of pain in her left great toe. She was seen by a podiatrist. She received a steroid injection and another medication whose name she does not recall. She started to develop urticaria and then a petechial rash. Over the next few weeks she had an episode where her left TMJ joint locking she could not open her mouth fully. She began to develop more extensive, palpable, purpura and burning of her skin. She brings a number of photographs which demonstrate extensive vasculitic changes of her skin. She developed primarily small joint pains in her hands and ankles and extremity swelling. She was evaluated by a hematologist, a rheumatologist, and her primary care physician. Skin biopsies were done and she was told they were consistent with vasculitis. She was started on a trial of low-dose prednisone with initial improvement of her symptoms but she had to stop the drug due to  significant insomnia, hyperactivity, and anxiety even at a 10 mg dose. 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 report not available. 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 There does not seem to be any pattern to them. Of great interest is the fact that she became pregnant last year with her first child and had a complete remission of all of her cutaneous and rheumatologic symptoms. She is now 10 weeks postpartum and is starting to break out again. She had had an episiotomy at time of vaginal  delivery. She had a wound repair that is not healing properly and had to have a second procedure  this week. She has not had any problems with wound healing in the past but really has not had any major surgical procedures. She had oral surgery to remove impacted wisdom teeth when she was in college Of note is that she has not seen fluctuations in her cutaneous or joint symptoms with her menstrual cycle. She took oral contraceptives in the past for about a year but didn't like the way they made her feel and stopped them and this was many years ago. She gets some myalgias as well as the arthralgias. No facial rash. No headaches. She does get dry eyes. No dry mouth or dysphagia. 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 If she sits in a hard chair or wear certain kinds of restrictive shoes this seems to precipitate some of her cutaneous rashes.  She has never had a blood transfusion. She denies the use of any intravenous drugs. She is heterosexual. Married for 7 years. She is a never smoker. No alcohol. She recently changed to a "ALCAT" diet which essentially doesn't allow any meat except fish. She was losing weight on this diet while she was pregnant and went back on a more liberal diet and gained the weight back.   She has seasonal allergies but no medication allergies.  She has no other medical ailments. No history of hepatitis, yellow jaundice, tuberculosis, malaria, mononucleosis, thyroid disease, seizure, stroke, blood clots.  There is no family history  of any bleeding disorder or collagen vascular disorder.  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   Social History:  She is married. Yoga instructor. No alcohol tobacco or drugs. She has a half-brother and half-sister with the same father mother is 64 years older than her sister is 25 years old. Sister was diagnosed with breast cancer at age 13 currently 68. Sister has other problems related to previous traumatic brain injury.  Family History: Family History  Problem Relation Age of Onset   Depression Mother    Hyperlipidemia Mother    Hypertension Mother    Hyperlipidemia Father    Hypertension Father    Cancer Maternal Grandmother     BREAST   Stroke Maternal Grandmother    Dementia Maternal Grandmother    Diabetes Maternal Grandmother    Cancer Maternal Grandfather    Heart disease Maternal Grandfather    Down syndrome Other    Cancer Other 72    BREAST  Father alive and healthy. Mother alive and well. She has had benign breast tumors removed.  Review of Systems: See history of present illness. She's had a problem with recurrent urinary tract infections. She is using a Mannose and cranberry extracts to reduce these. Her menstrual cycles are normal when she is not pregnant. She gets palmar erythema. Her mother also gets erythematous but not vasculitic changes of her skin. Remaining ROS negative.  Physical Exam: Blood pressure 106/65, pulse 79, temperature 96.9 F (36.1 C), temperature source Oral, resp. rate 18, height 5\' 4"  (1.626 m), weight 112 lb 14.4 oz (51.211 kg). Wt Readings from Last 3 Encounters:  09/29/13 112 lb 14.4 oz (51.211 kg)  09/16/13 109 lb (49.442 kg)  07/23/13 131 lb (59.421 kg)     General appearance: Well-nourished Caucasian woman.Fair skin, blonde hair HENNT: Pharynx no erythema, exudate, mass, or ulcer. No thyromegaly or thyroid nodules Lymph nodes: No cervical, supraclavicular, or axillary lymphadenopathy Breasts:  Lungs: Clear to  auscultation, resonant to percussion throughout Heart: Regular rhythm, no murmur, no gallop, no rub, no click, no edema Abdomen: Soft, nontender, normal bowel sounds, no mass, no organomegaly Extremities: No edema, no calf tenderness Musculoskeletal: no joint deformities GU:  Vascular: Carotid pulses 2+, no bruits, distal pulses: Dorsalis pedis not palpable but symmetric. Posterior tibial pulses are 1+ and symmetric. Radial and ulnar pulses both 1+ and symmetric. Neurologic: Alert, oriented, PERRLA, optic discs sharp and vessels normal, no hemorrhage or exudate, cranial nerves grossly normal, motor strength 5 over 5, reflexes 1+ symmetric, upper body coordination normal, gait normal, vibration sensation normal to tuning fork exam over the fingertips Skin: Palpable petechiae both lower extremities. No ecchymoses.    Lab Results: Lab Results: White count differential: 64% neutrophils, 27% lymphocytes, 7% monocytes, 2% eosinophils   Component Value Date   WBC 7.2 09/29/2013   HGB 14.0 09/29/2013   HCT 42.2 09/29/2013   MCV 91.9 09/29/2013   PLT 182 09/29/2013  Reticulocyte count 1%   Chemistry      Component Value Date/Time   NA 142 09/29/2013 0944  K 4.3 09/29/2013 0944   CO2 26 09/29/2013 0944   BUN 12.9 09/29/2013 0944   CREATININE 0.7 09/29/2013 0944      Component Value Date/Time   CALCIUM 10.3 09/29/2013 0944   ALKPHOS 116 09/29/2013 0944   AST 33 09/29/2013 0944   ALT 63* 09/29/2013 0944   BILITOT 0.32 09/29/2013 0944    ANA and rheumatoid factor both negative on 07/18/2012; C-reactive protein normal at 2.1, ESR 18 mm, B12 and folic acid levels normal, 07/13/2012. IgE level 17.1 mg percent which is low normal (reference range 0-180). 09/29/2013 LDH 151-normal   Pathology: No reports available.   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  She has no evidence for systemic disease with normal renal function, liver function, and the absence of any anemia. Someone gave her a diagnosis of Schamberg's Disease which is a family of skin conditions falling under the category of pigmented purpuric dermatoses (PPDs) which are benign and characterized by petechiae or purpura and usually localized hyperpigmentation. These conditions have a number of features including superficial perivascular inflammatory infiltrates primarily of CD4 positive lymphocytes, endothelial cell proliferation and edema, extravasation of erythrocytes in the papillary dermis, variable degrees of hemosiderin deposition in macrophages in the dermis, lymphocyte exocytosis, variable mild vacuolar degeneration of the basal cell layer of the skin. There are no definitive treatments. Topical steroids, UVB and PUVA have been used to treat this condition.  At present, her condition does not appear to be life threatening since she has normal organ function.  Plan: I'm going to check serum immunoglobulins and immunofixation electrophoresis, and do a cryoglobulin screen. IgE already back today and if anything is low arguing against a chronic allergic condition. It was probably done already but I will check an  ANCA panel. I am assuming she has already had complement testing and hepatitis testing as well. She has no HIV risk factors. The patient is working on her diet to see if there any potential food items that precipitate  I'll see her back in 2 weeks for results of above testing is available. her outbreaks.  It is interesting that she went into a complete remission during pregnancy which one can only attribute to high progesterone levels. In the absence of any definitive treatment for this disorder, I wonder if a trial of progesterone would be useful?        Levert Feinstein, MD 09/29/2013, 6:32 PM

## 2013-10-05 ENCOUNTER — Other Ambulatory Visit: Payer: Self-pay

## 2013-10-05 LAB — IMMUNOFIXATION ELECTROPHORESIS
IgM, Serum: 151 mg/dL (ref 52–322)
Total Protein, Serum Electrophoresis: 8.4 g/dL — ABNORMAL HIGH (ref 6.0–8.3)

## 2013-10-05 LAB — IGE: IgE (Immunoglobulin E), Serum: 17.1 IU/mL (ref 0.0–180.0)

## 2013-10-05 LAB — ANCA SCREEN W REFLEX TITER
Atypical p-ANCA Screen: NEGATIVE
p-ANCA Screen: NEGATIVE

## 2013-10-05 LAB — CRYOGLOBULIN

## 2013-10-13 ENCOUNTER — Other Ambulatory Visit (HOSPITAL_BASED_OUTPATIENT_CLINIC_OR_DEPARTMENT_OTHER): Payer: BC Managed Care – PPO | Admitting: Lab

## 2013-10-13 ENCOUNTER — Telehealth: Payer: Self-pay | Admitting: Oncology

## 2013-10-13 ENCOUNTER — Ambulatory Visit (HOSPITAL_BASED_OUTPATIENT_CLINIC_OR_DEPARTMENT_OTHER): Payer: BC Managed Care – PPO | Admitting: Oncology

## 2013-10-13 VITALS — BP 99/64 | HR 91 | Temp 98.2°F | Resp 91 | Ht 64.0 in | Wt 110.7 lb

## 2013-10-13 DIAGNOSIS — L959 Vasculitis limited to the skin, unspecified: Secondary | ICD-10-CM

## 2013-10-13 DIAGNOSIS — I776 Arteritis, unspecified: Secondary | ICD-10-CM

## 2013-10-13 DIAGNOSIS — D729 Disorder of white blood cells, unspecified: Secondary | ICD-10-CM

## 2013-10-13 DIAGNOSIS — L988 Other specified disorders of the skin and subcutaneous tissue: Secondary | ICD-10-CM

## 2013-10-13 NOTE — Telephone Encounter (Signed)
Gave pt appt for lab and MD on March 2015, moved to 1/17th due to call day ok per Dr. Reece Agar,

## 2013-10-13 NOTE — Progress Notes (Signed)
Short interval followup visit for this pleasant 32 year old woman who has been suffering from an unusual vasculitic syndrome involving the skin of her lower extremities for the last 3 years. She has had intermittent polyarthralgia and polymyalgia.  She has seen multiple specialists and has had biopsies on at least 2 occasions. A definitive diagnosis has not been made. 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 There is a question as to whether or not this is Schramberg's disease. It certainly fits the clinical picture for this skin disorder. Of interest, the disorder went into a complete remission while she was pregnant and recurred shortly after she delivered. This suggests some hormonal trigger.  The only lab abnormality from studies done at time of her October 31 visit here is an isolated elevation of IgA immunoglobulin at 1220 mg percent (normal less than 380) with no monoclonal proteins on immunofixation electrophoresis. IgE level is low normal at 17 units per mL (0-180) arguing against a chronic allergic condition A cryoglobulin screen was negative. She is not anemic. She has a normal white blood count and differential count and no abnormal cells on review of the peripheral blood film. Chemistry profile showed a isolated mild elevation of SGPT 63 normal up to 55 with normal SGOT 33 normal up to 34. Serum LDH normal at 151. ESR upper normal at 22 mm. ANA negative. 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 I told her to discuss this with her gynecologist. She appears to have a hormonal trigger to her disorder.  I am not sure what to make of the polyclonal elevation of IgA. It might be useful to have her  evaluated by an allergist/immunologist. I'm going to speak with a colleague from [facility] to see if they are interested in evaluating her.  Trental appears to have some benefit in patients with PPDs and may be an option for the future.  Should likely continue to follow her although I haven't been able to come up with a specific diagnosis. I will see her again in the spring.     Over 30 minutes spent with 100% with face-to-face counseling and coordination of care

## 2013-10-14 LAB — IGE: IgE (Immunoglobulin E), Serum: 17.3 IU/mL (ref 0.0–180.0)

## 2013-10-29 NOTE — MAU Provider Note (Signed)
History  CSN: [id number]  Arrival date and time: 09/16/13 1147   First Provider Initiated Contact with Patient 09/16/13 1353      Chief Complaint  Patient presents with   Vaginal Bleeding   HPI Pt presents unannounced to MAU with c/o of bleeding and clots from rt labialplasty performed in the [facility] office on 09/14/13 by Dr. Estanislado Pandy.  S/P vaginal delivery on 8/24 with 2nd degree perineal/rt labial laceration.  Reports she noted bleeding this AM and has been soaking pads and has noted several approx 1-2cm clots.  Denies fever or severe pain.  Denies odor.  Denies any trauma to the area.   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   Capillary fragility 2013   Hives 2013    Past Surgical History  Procedure Laterality Date   Wisdom tooth extraction  2002    Family History  Problem Relation Age of Onset   Depression Mother    Hyperlipidemia Mother    Hypertension Mother    Hyperlipidemia Father    Hypertension Father    Cancer Maternal Grandmother     BREAST   Stroke Maternal Grandmother    Dementia Maternal Grandmother    Diabetes Maternal Grandmother    Cancer Maternal Grandfather    Heart disease Maternal Grandfather    Down syndrome Other    Cancer Other 34    BREAST    History  Substance Use Topics   Smoking status: Never Smoker    Smokeless tobacco: Never Used   Alcohol Use: .5 - 1 oz/week    1-2 drink(s) per week     Comment: LAST HAD 12/10/2012    Allergies: No Known Allergies  No prescriptions prior to admission    Review of Systems  Constitutional: Negative.   HENT: Negative.   Eyes: Negative.   Respiratory: Negative.   Cardiovascular: Negative.   Gastrointestinal: Negative.   Genitourinary: Negative.   Musculoskeletal: Negative.   Skin: Negative.   Neurological:  Negative.   Endo/Heme/Allergies: Negative.   Psychiatric/Behavioral: Negative.    Physical Exam   Blood pressure 112/59, pulse 75, temperature 97.9 F (36.6 C), resp. rate 18, height 5\' 4"  (1.626 m), weight 49.442 kg (109 lb).  Physical Exam  Constitutional: She is oriented to person, place, and time. She appears well-developed and well-nourished.  HENT:  Head: Normocephalic and atraumatic.  Right Ear: External ear normal.  Left Ear: External ear normal.  Nose: Nose normal.  Eyes: Conjunctivae are normal. Pupils are equal, round, and reactive to light.  Neck: Normal range of motion. Neck supple.  Cardiovascular: Normal rate, regular rhythm and intact distal pulses.   Respiratory: Effort normal and breath sounds normal.  GI: Soft. Bowel sounds are normal.  Genitourinary: Vagina normal and uterus normal.  Inspection of rt labia with sutures intact however mod amt of bright red blood noted from skin edges as well as an approx 1cm clot present.  No redness, edema or evid of cellulitis noted.  Scant dk brown discharge in vag vault.  BUS neg.    Musculoskeletal: Normal range of motion.  Neurological: She is alert and oriented to person, place, and time. She has normal reflexes.  Skin: Skin is warm and dry.  Psychiatric: She has  a normal mood and affect. Her behavior is normal.    MAU Course  Procedures Right labia cleansed with normal saline.  Silver nitrate applied to oozing noted of skin edges.  Direct pressure also applied x 5 minutes.  At completion, no further oozing was noted.    Assessment and Plan  S/P right labialplasty Bleeding from laceration repair  RBA of the above procedures d/w pt prior to beginning and pt desires to proceed.  Pt tolerated without difficulty. Rec pt use ice/compression to the area to minimize any further bleeding as well as rest.   Pt to continue with medications for pain as previously prescribed. Pt to call for an appt to be seen at the office in the  next 4-5 days for f/u.    Carlyne Keehan O. 09/16/2013, 1:55 PM

## 2013-12-04 NOTE — Procedures (Signed)
°

## 2014-01-24 ENCOUNTER — Telehealth: Payer: Self-pay | Admitting: Oncology

## 2014-01-24 NOTE — Telephone Encounter (Signed)
R/s labs per pt rqst, emailed MD regarding MD visit , pt wants to r/s the 3/17th appt

## 2014-01-29 ENCOUNTER — Encounter: Payer: Self-pay | Admitting: Oncology

## 2014-02-06 ENCOUNTER — Other Ambulatory Visit: Payer: BC Managed Care – PPO

## 2014-02-09 ENCOUNTER — Other Ambulatory Visit: Payer: BC Managed Care – PPO

## 2014-02-13 ENCOUNTER — Ambulatory Visit: Payer: BC Managed Care – PPO | Admitting: Oncology

## 2014-02-16 ENCOUNTER — Ambulatory Visit: Payer: BC Managed Care – PPO | Admitting: Oncology

## 2014-06-26 ENCOUNTER — Ambulatory Visit (INDEPENDENT_AMBULATORY_CARE_PROVIDER_SITE_OTHER): Payer: 59 | Admitting: Oncology

## 2014-06-26 ENCOUNTER — Encounter: Payer: Self-pay | Admitting: Oncology

## 2014-06-26 VITALS — BP 110/69 | HR 82 | Temp 97.8°F | Ht 64.0 in | Wt 106.4 lb

## 2014-06-26 DIAGNOSIS — E8809 Other disorders of plasma-protein metabolism, not elsewhere classified: Secondary | ICD-10-CM

## 2014-06-26 DIAGNOSIS — R7989 Other specified abnormal findings of blood chemistry: Secondary | ICD-10-CM

## 2014-06-26 DIAGNOSIS — D890 Polyclonal hypergammaglobulinemia: Secondary | ICD-10-CM

## 2014-06-26 DIAGNOSIS — I776 Arteritis, unspecified: Secondary | ICD-10-CM

## 2014-06-26 DIAGNOSIS — R945 Abnormal results of liver function studies: Secondary | ICD-10-CM

## 2014-06-26 LAB — CBC WITH DIFFERENTIAL/PLATELET
BASOS ABS: 0 10*3/uL (ref 0.0–0.1)
Basophils Relative: 0 % (ref 0–1)
Eosinophils Absolute: 0.1 10*3/uL (ref 0.0–0.7)
Eosinophils Relative: 2 % (ref 0–5)
HEMATOCRIT: 37.7 % (ref 36.0–46.0)
HEMOGLOBIN: 12.5 g/dL (ref 12.0–15.0)
LYMPHS PCT: 29 % (ref 12–46)
Lymphs Abs: 1.9 10*3/uL (ref 0.7–4.0)
MCH: 30.5 pg (ref 26.0–34.0)
MCHC: 33.2 g/dL (ref 30.0–36.0)
MCV: 92 fL (ref 78.0–100.0)
MONO ABS: 0.4 10*3/uL (ref 0.1–1.0)
MONOS PCT: 6 % (ref 3–12)
NEUTROS ABS: 4.1 10*3/uL (ref 1.7–7.7)
Neutrophils Relative %: 63 % (ref 43–77)
Platelets: 199 10*3/uL (ref 150–400)
RBC: 4.1 MIL/uL (ref 3.87–5.11)
RDW: 13.7 % (ref 11.5–15.5)
WBC: 6.5 10*3/uL (ref 4.0–10.5)

## 2014-06-26 LAB — COMPREHENSIVE METABOLIC PANEL
ALK PHOS: 107 U/L (ref 39–117)
ALT: 11 U/L (ref 0–35)
AST: 14 U/L (ref 0–37)
Albumin: 3.6 g/dL (ref 3.5–5.2)
BUN: 14 mg/dL (ref 6–23)
CO2: 28 mEq/L (ref 19–32)
CREATININE: 0.55 mg/dL (ref 0.50–1.10)
Calcium: 9.5 mg/dL (ref 8.4–10.5)
Chloride: 102 mEq/L (ref 96–112)
Glucose, Bld: 83 mg/dL (ref 70–99)
Potassium: 4.7 mEq/L (ref 3.5–5.3)
SODIUM: 140 meq/L (ref 135–145)
TOTAL PROTEIN: 7.6 g/dL (ref 6.0–8.3)
Total Bilirubin: 0.2 mg/dL (ref 0.2–1.2)

## 2014-06-26 LAB — SEDIMENTATION RATE: SED RATE: 23 mm/h — AB (ref 0–22)

## 2014-06-26 LAB — SAVE SMEAR

## 2014-06-26 LAB — RETICULOCYTES
ABS Retic: 40.6 10*3/uL (ref 19.0–186.0)
RBC.: 4.1 MIL/uL (ref 3.87–5.11)
Retic Ct Pct: 0.99 % (ref 0.4–2.3)

## 2014-06-26 LAB — LACTATE DEHYDROGENASE: LDH: 135 U/L (ref 94–250)

## 2014-06-26 NOTE — Progress Notes (Signed)
Patient ID: [name] [name], female   DOB: Apr 29, 1981, 33 y.o.   MRN: [id number] Hematology and Oncology Follow Up Visit  Tanji Storrs [id number] 1981-04-17 33 y.o. 06/26/2014 7:43 PM   Principle Diagnosis: Encounter Diagnoses  Name Primary?   Vasculitis    Polyclonal gammopathy determined by serum protein electrophoresis    Abnormal liver function tests Yes     Interim History:  Followup visit for this 33 year old woman I initially saw  in consultation back in October of 2014. She was referred for an atypical vasculitic rash of her lower extremities. She had no associated anemia or thrombocytopenia. She had intermittent arthralgias primarily in the small joints. She also had myalgias primarily of her lower extremities. She had previous skin biopsies done when she was living in [place]. 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 Of interest, all of her symptoms resolved when she was pregnant. I screened her for a number of possible underlying immune deficiency syndromes. The only lab abnormality from studies done at time of her October 31 visit here was an isolated elevation of IgA immunoglobulin at 1220 mg percent (normal less than 380) with no monoclonal proteins on immunofixation electrophoresis.  IgE level is low normal at 17 units per mL (0-180) arguing against a chronic allergic condition  A cryoglobulin screen was negative.   She has a normal white blood count and differential count and no abnormal cells on review of the peripheral blood film.  Chemistry profile showed a isolated mild elevation of SGPT 63 normal up to 55 with normal SGOT 33 normal up to 34.  Serum LDH normal at 151.  ESR upper normal at 22 mm.  ANA negative.  ANCA screen negative  Previous testing through her gynecologist office  showed negative lupus-type anticoagulant.  I suggested that perhaps a trial of progesterone might help given the fact that her  symptoms resolved during pregnancy. She did go ahead and take 100 mg of progesterone for 30 days with no relief of her symptoms. She was prescribed some Naprosyn initially 550 mg every 12 hours currently 500 mg every 12 hours with significant relief of her lower extremity pain. She finds that the Naprosyn works better when she takes it with a nutritional supplement called "Curamed" which is a mixture of  number of things in clothing curcumin, tumeric essential oils, phospho lipids, etc.     Medications:  Current outpatient prescriptions:Ascorbic Acid (VITAMIN C PO), Take 1 tablet by mouth daily. 1000 mg, Disp: , Rfl: ;  Cholecalciferol (VITAMIN D PO), Take 1 capsule by mouth daily. 5000 mg, Disp: , Rfl: ;  Cranberry 500 MG CAPS, Take 1 capsule by mouth daily., Disp: , Rfl: ;  D-MANNOSE PO, Take 1 tablet by mouth daily., Disp: , Rfl: ;  Miconazole POWD, Apply topically as needed. After breast feedings, Disp: , Rfl:  NON FORMULARY, Juice plus capsules 4 each ultra, garden, & orchard daily, Disp: , Rfl: ;  Nutritional Supplements (JUICE PLUS FIBRE PO), Take by mouth daily. This is a shake, Disp: , Rfl:   Allergies: No Known Allergies  Review of Systems: Hematology:  No bleeding or bruising ENT ROS: No sore throat Breast ROS:  Respiratory ROS: No cough or dyspnea Cardiovascular ROS: No chest pain or palpitations  Gastrointestinal ROS: No abdominal pain or change in bowel habit   Genito-Urinary ROS: Not questioned Musculoskeletal ROS: See history of present illness Neurological ROS: No headache or change in vision Dermatological ROS: See  history of present illness Remaining ROS negative:   Physical Exam: Blood pressure 110/69, pulse 82, temperature 97.8 F (36.6 C), temperature source Oral, height _0  (1.626 m), weight 106 lb 6.4 oz (48.263 kg), SpO2 99.00%. Wt Readings from Last 3 Encounters:  06/26/14 106 lb 6.4 oz (48.263 kg)  10/13/13 110 lb 11.2 oz (50.213 kg)  09/29/13 112 lb 14.4  oz (51.211 kg)     General appearance: Thin, well-nourished, Caucasian woman HENNT: Pharynx no erythema, exudate, mass, or ulcer. No thyromegaly or thyroid nodules Lymph nodes: No cervical, supraclavicular, or axillary lymphadenopathy Breasts Lungs: Clear to auscultation, resonant to percussion throughout Heart: Regular rhythm, no murmur, no gallop, no rub, no click, no edema Abdomen: Soft, nontender, normal bowel sounds, no mass, no organomegaly Extremities: No edema, no calf tenderness Musculoskeletal: no joint deformities GU:  Vascular: Carotid pulses 2+, no bruits, distal pulses: Dorsalis pedis 1+ symmetric Neurologic: Alert, oriented, PERRLA, optic discs sharp and vessels normal, no hemorrhage or exudate, cranial nerves grossly normal, motor strength 5 over 5, reflexes 1+ symmetric, upper body coordination normal, gait normal, Skin: Faint, maculopapular, nonpalpable, rash on the shins of both legs.  Lab Results: CBC W/Diff    Component Value Date/Time   WBC 6.5 06/26/2014 1047   WBC 7.2 09/29/2013 0944   RBC 4.10 06/26/2014 1047   RBC 4.10 06/26/2014 1047   RBC 4.59 09/29/2013 0944   HGB 12.5 06/26/2014 1047   HGB 14.0 09/29/2013 0944   HCT 37.7 06/26/2014 1047   HCT 42.2 09/29/2013 0944   PLT 199 06/26/2014 1047   PLT 182 09/29/2013 0944   MCV 92.0 06/26/2014 1047   MCV 91.9 09/29/2013 0944   MCH 30.5 06/26/2014 1047   MCH 30.5 09/29/2013 0944   MCHC 33.2 06/26/2014 1047   MCHC 33.2 09/29/2013 0944   RDW 13.7 06/26/2014 1047   RDW 13.2 09/29/2013 0944   LYMPHSABS 1.9 06/26/2014 1047   LYMPHSABS 2.0 09/29/2013 0944   MONOABS 0.4 06/26/2014 1047   MONOABS 0.5 09/29/2013 0944   EOSABS 0.1 06/26/2014 1047   EOSABS 0.2 09/29/2013 0944   BASOSABS 0.0 06/26/2014 1047   BASOSABS 0.0 09/29/2013 0944     Chemistry      Component Value Date/Time   NA 140 06/26/2014 1047   NA 142 09/29/2013 0944   K 4.7 06/26/2014 1047   K 4.3 09/29/2013 0944   CL 102 06/26/2014 1047   CO2 28  06/26/2014 1047   CO2 26 09/29/2013 0944   BUN 14 06/26/2014 1047   BUN 12.9 09/29/2013 0944   CREATININE 0.55 06/26/2014 1047   CREATININE 0.7 09/29/2013 0944      Component Value Date/Time   CALCIUM 9.5 06/26/2014 1047   CALCIUM 10.3 09/29/2013 0944   ALKPHOS 107 06/26/2014 1047   ALKPHOS 116 09/29/2013 0944   AST 14 06/26/2014 1047   AST 33 09/29/2013 0944   ALT 11 06/26/2014 1047   ALT 63* 09/29/2013 0944   BILITOT 0.2 06/26/2014 1047   BILITOT 0.32 09/29/2013 0944     ESR: 23 mm White count differentials: 63% neutrophils, 29% lymphs, 6 monocytes, 2 eosinophils Chemistry profile: Resolution of previous mild liver function abnormalities. All her function is currently normal. LDH 135.  Impression:  Idiopathic vasculitis and primarily lower extremity pain/myalgias with a polyclonal elevation of serum IgA.  In reviewing with her and her father who accompanies her today, other possibilities that might explain the elevated IgA, IgM across a rare syndrome of hyper IgD  which can be associated with a elevated IgA level and encompasses many of her symptoms. Although it is a long shot, I will do ahead and check an IgD level.  I have reached the limit of what I can offer  this unfortunate lady. I would like to get an official rheumatology opinion. I am making a referral to Dr. Patrecia Pour. She has not seen a rheumatologist and she left [place] a few years ago. Maybe someone else has some ideas that might help her. In the interim I encouraged to continue the Naprosyn. I would advise that she have labs checked in at least twice yearly to assess liver and renal function on this drug.  Over 40 minutes spent with this patient with 100% direct face-to-face patient contact, exam, and coordination of care.   CC: Patient Care Team: Pcp Not In System as PCP - General   Annia Belt, MD 7/28/20157:43 PM

## 2014-06-26 NOTE — Patient Instructions (Addendum)
To lab today - we will call you with results We will make a referral to Dr Titus Dubinevashwar,  Rheumatology Return visit 1 year

## 2014-06-28 LAB — IGD: IgD: 30 mg/L (ref <179)

## 2014-06-29 ENCOUNTER — Telehealth: Payer: Self-pay | Admitting: *Deleted

## 2014-06-29 NOTE — Telephone Encounter (Signed)
Message copied by Hassan BucklerPALMER, Toni Demo H on Fri Jun 29, 2014  3:41 PM ------      Message from: Levert FeinsteinGRANFORTUNA, JAMES M      Created: Thu Jun 28, 2014  3:40 PM       Call pt: liver functions all normal.  D antibody level normal ------

## 2014-06-29 NOTE — Telephone Encounter (Signed)
Called pt - no answer and mailbox is full.

## 2014-07-06 ENCOUNTER — Telehealth: Payer: Self-pay | Admitting: *Deleted

## 2014-07-06 NOTE — Telephone Encounter (Signed)
Pt aware of lab results per Dr Beryle Beams -  Liver functions all normal. D antibody level normal. [name] Blades Nikolina Simerson RN 07/06/14 9:20AM   [name] [name] More Detail >>      [name] Belt, MD      Sent: Thu June 28, 2014  3:40 PM      To: Ebbie Latus, RN              Result Note      Call pt: liver functions all normal.  D antibody level normal          Attached to      CBC WITH DIFFERENTIAL (Order# [id number]); RETICULOCYTES (Order# [id number]); SEDIMENTATION RATE (Order# [id number]); COMPREHENSIVE METABOLIC PANEL (Order# [id number]); LACTATE DEHYDROGENASE (Order# [id number]); IGD (Order# [id number]); SAVE SMEAR (Order# [id number])            IgD  Status: Final result     Visible to patient: This result is viewable by the patient in [place].     Next appt: None     Dx: Abnormal liver function tests; Vascul...            Notes Recorded by [name] Belt, MD on 06/28/2014 at 3:40 PM Call pt: liver functions all normal.  D antibody level normal      Ref Range 1wk ago     IgD <179 mg/L 30       Resulting Agency [facility]            Result Narrative            Performed at:  [facility]                [address] [place] [address]           Specimen Collected: [name] 10:47 AM Last Resulted: 06/28/14  2:20 PM Lab Flowsheet Order Details View Encounter Lab and Collection Details Routing Result History            CBC with Differential  Status: Edited Result - FINAL     Visible to patient: This result is viewable by the patient in [place].     Next appt: None     Dx: Abnormal liver function tests; Vascul...            Notes Recorded by [name] Belt, MD on 06/28/2014 at 3:40 PM Call pt: liver functions all normal.  D antibody level normal        Ref Range 1wk ago (06/26/14) 1wk ago (06/26/14) 69moago (09/29/13) 141mogo (07/24/13) 1y10yro (01/05/13)     WBC 4.0 - 10.5 K/uL 6.5      7.2 R   14.3 (H)    10.0        Comments[facility]  RBC 3.87 - 5.11 MIL/uL 4.10      4.59 R   3.83 (L)    4.22        Hemoglobin 12.0 - 15.0 g/dL 12.5      14.0 R   11.8 (L)    13.4        HCT 36.0 - 46.0 % 37.7      42.2 R   35.2 (L)    38.5        MCV 78.0 - 100.0 fL 92.0      91.9 R  91.9    91.2        MCH 26.0 - 34.0 pg 30.5      30.5 R   30.8    31.8        MCHC 30.0 - 36.0 g/dL 33.2      33.2 R   33.5    34.8        RDW 11.5 - 15.5 % 13.7      13.2 R   14.2    14.0        Platelets 150 - 400 K/uL 199      182 R   164    206        Neutrophils Relative % 43 - 77 % 63          74        Neutro Abs 1.7 - 7.7 K/uL 4.1      4.6 R     7.3        Lymphocytes Relative 12 - 46 % 29          19        Lymphs Abs 0.7 - 4.0 K/uL 1.9          1.9        Monocytes Relative 3 - 12 % 6          7        Monocytes Absolute 0.1 - 1.0 K/uL 0.4          0.7        Eosinophils Relative 0 - 5 % 2          0        Eosinophils Absolute 0.0 - 0.7 K/uL 0.1      0.2 R     0.0        Basophils Relative 0 - 1 % 0          0        Basophils Absolute 0.0 - 0.1 K/uL 0.0      0.0 R     0.0        Smear Review   Criteria for revie...    SMEAR STAINED AND .Marland KitchenMarland Kitchen        Criteria for revie...       Criteria for review not met    Resulting Agency   [facility]          Result Narrative            Performed at:  [address]                [place], [place] [address]           Specimen Collected: [name] 10:47 AM Last Resulted: [name] 11:38 AM Lab Flowsheet Order Details View Encounter Lab and Collection Details Routing Result History  R=Reference range differs from displayed range              Sedimentation rate  Status: Edited Result - FINAL     Visible to patient: This result is viewable by the patient in [place].     Next appt: None     Dx: Abnormal liver function tests;  Vascul...               Notes Recorded by [name] Belt, MD on 06/28/2014 at  3:40 PM Call pt: liver functions all normal.  D antibody level normal        Ref Range 1wk ago  27moago      Sed Rate 0 - 22 mm/hr 23 (H)    21       Resulting Agency   [facility]          Result Narrative            Performed at:  [address]               [place] [address]          Specimen Collected: [name] 10:47 AM Last Resulted: [name] 12:23 PM Lab Flowsheet Order Details View Encounter Lab and Collection Details Routing Result History            Reticulocytes  Status: Edited Result - FINAL     Visible to patient: This result is viewable by the patient in [place]     Next appt: None     Dx: Abnormal liver function tests; Vascul...            Notes Recorded by JAnnia Belt MD on 06/28/2014 at 3:40 PM Call pt: liver functions all normal.  D antibody level normal      Ref Range 1wk ago     Retic Ct Pct 0.4 - 2.3 % 0.99       Comments: [facility]   RBC. 3.87 - 5.11 MIL/uL 4.10       ABS Retic 19.0 - 186.0 K/uL 40.6       Resulting Agency [facility]            Result Narrative            Performed at:  [address]               [place],  [id number]          Specimen Collected: [name] 10:47 AM Last Resulted: [name] 11:38 AM Lab Flowsheet Order Details View Encounter Lab and Collection Details Routing Result History            Comprehensive metabolic panel  Status: Edited Result - FINAL     Visible to patient: This result is viewable by the patient in [place]     Next appt: None     Dx: Abnormal liver function tests; Vascul...            Notes Recorded by JAnnia Belt MD on 06/28/2014 at 3:40 PM Call pt: liver functions all normal.  D antibody level normal        Ref Range 1wk ago  91mogo      Sodium 135 -  145 mEq/L 140    142 R       Potassium 3.5 - 5.3 mEq/L 4.7    4.3 R       Chloride 96 - 112 mEq/L 102    105 R       CO2 19 - 32 mEq/L 28    26 R       Glucose, Bld 70 - 99 mg/dL 83    65 (L) R  BUN 6 - 23 mg/dL 14    44.4 R       Creat 0.50 - 1.10 mg/dL 8.31    0.7 R       Total Bilirubin 0.2 - 1.2 mg/dL 0.2    2.79 R       Alkaline Phosphatase 39 - 117 U/L 107    116 R       AST 0 - 37 U/L 14    33 R       ALT 0 - 35 U/L 11    63 (H) R       Total Protein 6.0 - 8.3 g/dL 7.6    8.6 (H) R       Albumin 3.5 - 5.2 g/dL 3.6    4.1 R       Calcium 8.4 - 10.5 mg/dL 9.5    75.3 R      Resulting Agency   [facility]          Result Narrative            Performed at:  [facility]                [address]                [place], [place] [address]           Specimen Collected: [name] 10:47 AM Last Resulted: [name] 12:07 PM Lab Flowsheet Order Details View Encounter Lab and Collection Details Routing Result History  R=Reference range differs from displayed range              Lactate dehydrogenase  Status: Edited Result - FINAL     Visible to patient: This result is viewable by the patient in MyChart.     Next appt: None     Dx: Abnormal liver function tests; Vascul...            Notes Recorded by Levert Feinstein, MD on 06/28/2014 at 3:40 PM Call pt: liver functions all normal.  D antibody level normal        Ref Range 1wk ago  86mo ago      LDH 94 - 250 U/L 135    151 R      Resulting Agency   [facility]          Result Narrative            Performed at:  [facility]                [address], [address]                [place], [place] [address]           Specimen Collected: [name] 10:47 AM Last Resulted: [name] 12:07 PM Lab Flowsheet Order Details View Encounter Lab and Collection Details Routing Result History  R=Reference range differs from displayed range                  Previously Reviewed Results                Save smear  Status: Final result     Visible to patient: This result is viewable by the patient in MyChart.     Next appt: None     Dx: Abnormal liver function tests; Vascul...            Notes Recorded by Levert Feinstein, MD on 06/28/2014  at 3:40 PM Call pt: liver functions all normal.  D antibody level normal

## 2014-07-06 NOTE — Progress Notes (Signed)
Called pt - results given to pt per Dr Cyndie ChimeGranfortuna.

## 2014-10-01 ENCOUNTER — Encounter: Payer: Self-pay | Admitting: Oncology

## 2015-06-25 ENCOUNTER — Ambulatory Visit: Payer: Self-pay | Admitting: Oncology

## 2016-11-16 ENCOUNTER — Other Ambulatory Visit: Payer: Self-pay | Admitting: Obstetrics and Gynecology

## 2016-11-16 DIAGNOSIS — N644 Mastodynia: Secondary | ICD-10-CM

## 2016-11-16 DIAGNOSIS — N63 Unspecified lump in unspecified breast: Secondary | ICD-10-CM

## 2016-11-24 ENCOUNTER — Ambulatory Visit
Admission: RE | Admit: 2016-11-24 | Discharge: 2016-11-24 | Disposition: A | Discharge status: Home / Self Care | Payer: Self-pay | Source: Ambulatory Visit | Attending: Obstetrics and Gynecology | Admitting: Obstetrics and Gynecology

## 2016-11-24 DIAGNOSIS — N63 Unspecified lump in unspecified breast: Secondary | ICD-10-CM

## 2016-11-24 DIAGNOSIS — N644 Mastodynia: Secondary | ICD-10-CM

## 2020-05-24 DIAGNOSIS — M533 Sacrococcygeal disorders, not elsewhere classified: Secondary | ICD-10-CM | POA: Insufficient documentation

## 2021-01-07 ENCOUNTER — Other Ambulatory Visit: Payer: Self-pay

## 2021-01-07 ENCOUNTER — Encounter: Payer: Self-pay | Admitting: Family Medicine

## 2021-01-07 ENCOUNTER — Ambulatory Visit: Payer: Self-pay

## 2021-01-07 ENCOUNTER — Ambulatory Visit (INDEPENDENT_AMBULATORY_CARE_PROVIDER_SITE_OTHER): Payer: Self-pay | Admitting: Family Medicine

## 2021-01-07 DIAGNOSIS — M79651 Pain in right thigh: Secondary | ICD-10-CM

## 2021-01-07 NOTE — Progress Notes (Signed)
Office Visit Note   Patient: [name] [name]           Date of Birth: 1981/11/05           MRN: [id number] Visit Date: 01/07/2021 Requested by: No referring provider defined for this encounter. PCP: Pcp, No  Subjective: Chief Complaint  Patient presents with  . Right Thigh - Pain    Pain in the proximal hamstring area, actively 1-2 months. 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 Has had dry needling x 2, most recently last week. This has helped. [name] suggested that she have an ultrasound.    HPI: 40yo Avid runner presenting to clinic with concerns of pain at the medial aspect of her ischial tuberosity for the past 1-2 months. Patient states she has a history of a hamstring injury, and this feels somewhat similar, however her pain is much more severe than her past injury, and much more focused. Pain is worsened with running, as well as daily fitness exercises (works out at [employer] in addition to her running activities). She has sought care with dry needling, which she says has helped significantly, as well as chiropractic care and aggressive stretching. She is also curious about R[place]. Patient sent her from her Physical therapist for concerns of a possible hamstring tear, or consideration of Prolotherapy. Patient is otherwise healthy, and states she is very frustrated and scared that this injury will slow her running pursuits.                ROS:   States she was playing paintball this past weekend, and has numerous bruises after this activity. All other systems were reviewed and are negative.  Objective: Vital Signs: There were no vitals taken for this visit.  Physical Exam:  General:  Alert and oriented, in no acute distress. Pulm:  Breathing unlabored. Psy:  Normal mood, congruent affect. Skin:  Dose have scattered bruising over bilateral thighs. No rashes. No erythema.   Normal Gait.  Pelvis with no obvious asymmetry or deformities.  Significant  tenderness to palpation along medial aspect of right ischial tuberosity, which is worsened with resisted hamstring flexion, as well as resisted external rotation of the hip.  5/5 strength with hip flexion, abduction and adduction.  Sensation intact.   Imaging: Limited Extremity Ultrasound: Right Proximal Hamstrings Medial aspect of right ischial tuberosity with calcification appreciated at hamstring insertion. Tendon appears intact, though does have some hypoechoic changes. No significant overlying fluid. No significant bursal enlargement.   Assessment & Plan: 40yo F presenting to clinic with concerns of right proximal hamstring insertion pain. Examination and ultrasound concerning for proximal hamstring tendonopathy.  - Discussed that, given her improvement thus far with dry needling, she should continue this modality. However, would recommend ceasing the aggressive massage, and stretching.  - Discussed eccentric exercises - Once she has completed her care with her dry needling therapist, would recommend rehabilitation exercises with sports focused PT to help regain hamstring strength and prevent reinjury.  - If no benefit with above, would consider prolotherapy injections vs ESWT - Patient expresses understanding. She had no further questions or concerns today.      Procedures: No procedures performed        PMFS History: Patient Active Problem List   Diagnosis Date Noted  . Sacral back pain 05/24/2020  . NSVD (normal spontaneous vaginal delivery) 07/24/2013  . Second-degree perineal laceration, with delivery 07/24/2013  . Anemia 07/24/2013  . Vasculitis (HCC) 02/03/2013  .  History of recurrent UTI (urinary tract infection) 02/03/2013  . 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 Capillary fragility (HCC) 2013  . Hives 2013  . Infection    HX FREQUENT UTI'S  . 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 Depression Mother   . Hyperlipidemia Mother   . Hypertension Mother   . Hyperlipidemia Father   . Hypertension Father   . Cancer Maternal Grandmother        BREAST  . Stroke Maternal Grandmother   . Dementia Maternal Grandmother   . Diabetes Maternal Grandmother   . Cancer Maternal Grandfather   . Heart disease Maternal Grandfather   . Down syndrome Other   . Cancer Other 39       BREAST    Past Surgical History:  Procedure Laterality Date  . WISDOM TOOTH EXTRACTION  2002   Social History   Occupational History  . Occupation: YOGA INSTRUCTOR    Employer: [employer]  Tobacco Use  . Smoking status: Never Smoker  . Smokeless tobacco: Never Used  Substance and Sexual Activity  . Alcohol use: No    Alcohol/week: 1.0 - 2.0 standard drink    Types: 1 - 2 drink(s) per week  . Drug use: No  . Sexual activity: Not on file

## 2021-01-07 NOTE — Progress Notes (Signed)
I saw and examined the patient with Dr. Marga Hoots and agree with assessment and plan as outlined.    Right proximal hamstring tendinopathy.  Much better after dry needling treatment.    Recommended sticking with only dry needling for the next month.  If pain continues to improve, then start working with Beryle Flock for return-to-sport rehab.  If reaches a plateau, then we'll try dextrose prolotherapy.

## 2021-03-11 ENCOUNTER — Other Ambulatory Visit: Payer: Self-pay

## 2021-03-11 ENCOUNTER — Ambulatory Visit: Payer: BC Managed Care – PPO | Attending: Obstetrics and Gynecology | Admitting: Physical Therapy

## 2021-03-11 ENCOUNTER — Encounter: Payer: Self-pay | Admitting: Physical Therapy

## 2021-03-11 DIAGNOSIS — R252 Cramp and spasm: Secondary | ICD-10-CM | POA: Insufficient documentation

## 2021-03-11 DIAGNOSIS — M6281 Muscle weakness (generalized): Secondary | ICD-10-CM | POA: Insufficient documentation

## 2021-03-11 NOTE — Therapy (Signed)
[facility] [address], [address] [place], [place], [address] Phone: [phone]   Fax:  [phone]  Physical Therapy Evaluation  Patient Details  Name: [name] [name] MRN: [id number] Date of Birth: 1981-07-04 Referring Provider (PT): Nigel Bridgeman, [place]   Encounter Date: 03/11/2021   PT End of Session - 03/11/21 1418    Visit Number 1    Date for PT Re-Evaluation 06/03/21    Authorization Type bcbs    PT Start Time 1409   late   PT Stop Time 1452    PT Time Calculation (min) 43 min    Activity Tolerance Patient tolerated treatment well    Behavior During Therapy [facility] for tasks assessed/performed           Past Medical History:  Diagnosis Date  . Capillary fragility (HCC) 2013  . Hives 2013  . Infection    HX FREQUENT UTI'S  . 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 WISDOM TOOTH EXTRACTION  2002    There were no vitals filed for this visit.    Subjective Assessment - 03/11/21 1412    Subjective I have to pee a lot when working out and running.  I have a little leakage with high intensity running and exercise.  I have to hold when I sneeze.    Limitations Other (comment)   exercise and coughing/sneezing   Patient Stated Goals be able to go hiking or exercise without stopping    Currently in Pain? No/denies              [facility] PT Assessment - 03/12/21 0001      Assessment   Medical Diagnosis M62.9 (ICD-10-CM) - Disorder of muscle, unspecified; M62.89 (ICD-10-CM) - Pelvic floor dysfunction    Referring Provider (PT) Nigel Bridgeman, CNM    Prior Therapy for thigh pain      Precautions   Precautions None      Restrictions   Weight Bearing Restrictions No      Balance Screen   Has the patient fallen in the past 6 months No      Home Environment   Living Environment Private residence    Living Arrangements Spouse/significant other;Children      Prior  Function   Level of Independence Independent    Vocation Full time employment    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     Pelvic Floor Special Questions - 03/12/21 0001    Are you Pregnant or attempting pregnancy? No    Number of Pregnancies 1    Number of Vaginal Deliveries 1    Currently Sexually Active No    Urinary Leakage Yes    Urinary urgency No    Fluid intake 100-120oz    Falling out feeling (prolapse) No  Prolapse None    Pelvic Floor Internal Exam pt identity confimred and informed consent given to perform internal soft tissue assessment    Exam Type Vaginal    Palpation elevated tone levators Rt>Lt and TTP OI Rt side, urethra irritated and bulging a little; hard to relax    Strength weak squeeze, no lift    Strength # of reps 1   8 very small in 10 seconds   Strength # of seconds 3    Tone high            [facility] Adult PT Treatment/Exercise - 03/11/21 0001      Self-Care   Self-Care Other Self-Care Comments    Other Self-Care Comments  arch lifting in standing and weight shift, squat; deep breathing and relaxing pelvic floor      Exercises   Exercises Other Exercises                    PT Short Term Goals - 03/12/21 1744      PT SHORT TERM GOAL #1   Title ind with initial HEP    Time 4    Period Weeks    Status New    Target Date 04/08/21             PT Long Term Goals - 03/12/21 1741      PT LONG TERM GOAL #1    Title Pt will be ind with HEP    Time 12    Period Weeks    Status New    Target Date 06/03/21      PT LONG TERM GOAL #2   Title Pt will be able to sprint 100 ft or more without leakage for return to HIIT exercises    Time 12    Period Weeks    Status New    Target Date 06/03/21      PT LONG TERM GOAL #3   Title Pt will feel confident she can go for a hike due to improved strength and needing to void less frequently    Time 12    Period Weeks    Status New    Target Date 06/03/21      PT LONG TERM GOAL #4   Title Pt will report she has no inguinal pain or pain radiating into hamstring due to improve self massage techniques    Time 12    Period Weeks    Status New    Target Date 06/03/21                  Plan - 03/12/21 1745    Clinical Impression Statement Pt presents to clinic due to urinary leakage.  Pt has high tone muscles throughout the pelvic floor with Rt>Lt and TTP of OI on the Rt side and can only hold contraction for 3 seconds.  Pt unable to relax after performing kegel but can do 8 quick flicks with less strength in 10 seconds. Pt has posture deficits with Rt side weaker than left and more medial arch collapse on Rt side.  Pt is TTP Rt medial ischial tuberosity at medial hamstring attachment.  Pt also has more anterior rotation of Rt ilium with fwd flexion Rt hip flexion tension. Pt will benefit from skilled PT to address impairments to manage urinary leakage and pain so she can return to maximum funciton.    Personal Factors and Comorbidities Time since onset of injury/illness/exacerbation    Examination-Activity Limitations Continence  Examination-Participation Restrictions Community Activity;Occupation    Stability/Clinical Decision Making Stable/Uncomplicated    Clinical Decision Making Low    Rehab Potential Excellent    PT Frequency 1x / week    PT Duration 12 weeks    PT Treatment/Interventions Neuromuscular re-education;Manual  techniques;Patient/family education;Therapeutic activities;Therapeutic exercise;ADLs/Self Care Home Management;Biofeedback;Taping;Passive range of motion;Dry needling    PT Next Visit Plan internal STM and maybe review pelvic wand - Rt obdurator; isolated pelvic floor kegels    PT Home Exercise Plan ask how internal STM release and arch lift is going    Consulted and Agree with Plan of Care Patient           Patient will benefit from skilled therapeutic intervention in order to improve the following deficits and impairments:  Decreased endurance,Decreased range of motion,Decreased strength,Increased fascial restricitons,Pain,Decreased coordination,Increased muscle spasms  Visit Diagnosis: Muscle weakness (generalized)  Cramp and spasm     Problem List Patient Active Problem List   Diagnosis Date Noted  . Sacral back pain 05/24/2020  . NSVD (normal spontaneous vaginal delivery) 07/24/2013  . Second-degree perineal laceration, with delivery 07/24/2013  . Anemia 07/24/2013  . Vasculitis (HCC) 02/03/2013  . History of recurrent UTI (urinary tract infection) 02/03/2013  . 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 [place], [place], [address] Phone: [phone]   Fax:  [phone]  Name: Vastie Douty MRN: [id number] Date of Birth: [birth year]

## 2021-03-18 ENCOUNTER — Ambulatory Visit: Payer: BC Managed Care – PPO | Admitting: Physical Therapy

## 2021-04-24 ENCOUNTER — Encounter: Payer: BC Managed Care – PPO | Admitting: Physical Therapy

## 2021-05-01 ENCOUNTER — Encounter: Payer: BC Managed Care – PPO | Admitting: Physical Therapy

## 2021-05-08 ENCOUNTER — Encounter: Payer: BC Managed Care – PPO | Admitting: Physical Therapy

## 2021-05-15 ENCOUNTER — Encounter: Payer: BC Managed Care – PPO | Admitting: Physical Therapy

## 2021-05-22 ENCOUNTER — Encounter: Payer: BC Managed Care – PPO | Admitting: Physical Therapy

## 2021-06-08 ENCOUNTER — Inpatient Hospital Stay (HOSPITAL_COMMUNITY)
Admission: AD | Admit: 2021-06-08 | Discharge: 2021-06-08 | Disposition: A | Discharge status: Home / Self Care | Payer: BC Managed Care – PPO | Attending: Obstetrics and Gynecology | Admitting: Obstetrics and Gynecology

## 2021-06-08 ENCOUNTER — Inpatient Hospital Stay (HOSPITAL_BASED_OUTPATIENT_CLINIC_OR_DEPARTMENT_OTHER): Payer: BC Managed Care – PPO

## 2021-06-08 ENCOUNTER — Encounter (HOSPITAL_COMMUNITY): Payer: Self-pay

## 2021-06-08 DIAGNOSIS — O402XX0 Polyhydramnios, second trimester, not applicable or unspecified: Secondary | ICD-10-CM | POA: Diagnosis not present

## 2021-06-08 DIAGNOSIS — O321XX0 Maternal care for breech presentation, not applicable or unspecified: Secondary | ICD-10-CM | POA: Diagnosis not present

## 2021-06-08 DIAGNOSIS — Z3A16 16 weeks gestation of pregnancy: Secondary | ICD-10-CM | POA: Insufficient documentation

## 2021-06-08 DIAGNOSIS — N898 Other specified noninflammatory disorders of vagina: Secondary | ICD-10-CM | POA: Diagnosis present

## 2021-06-08 DIAGNOSIS — O4102X0 Oligohydramnios, second trimester, not applicable or unspecified: Secondary | ICD-10-CM

## 2021-06-08 DIAGNOSIS — O42912 Preterm premature rupture of membranes, unspecified as to length of time between rupture and onset of labor, second trimester: Secondary | ICD-10-CM

## 2021-06-08 LAB — URINALYSIS, ROUTINE W REFLEX MICROSCOPIC
Bilirubin Urine: NEGATIVE
Glucose, UA: NEGATIVE mg/dL
Hgb urine dipstick: NEGATIVE
Ketones, ur: NEGATIVE mg/dL
Leukocytes,Ua: NEGATIVE
Nitrite: NEGATIVE
Protein, ur: NEGATIVE mg/dL
Specific Gravity, Urine: 1.009 (ref 1.005–1.030)
pH: 7 (ref 5.0–8.0)

## 2021-06-08 LAB — WET PREP, GENITAL
Clue Cells Wet Prep HPF POC: NONE SEEN
Sperm: NONE SEEN
Trich, Wet Prep: NONE SEEN
Yeast Wet Prep HPF POC: NONE SEEN

## 2021-06-08 LAB — AMNISURE RUPTURE OF MEMBRANE (ROM) NOT AT ARMC: Amnisure ROM: POSITIVE

## 2021-06-08 IMAGING — US US MFM OB LIMITED
1 series · 15 of 18 positions shown · non-contrast
Comparison: none

[Series 1: us mfm ob limited · 18 acquisitions, 15 frames shown]
[im 1/18]
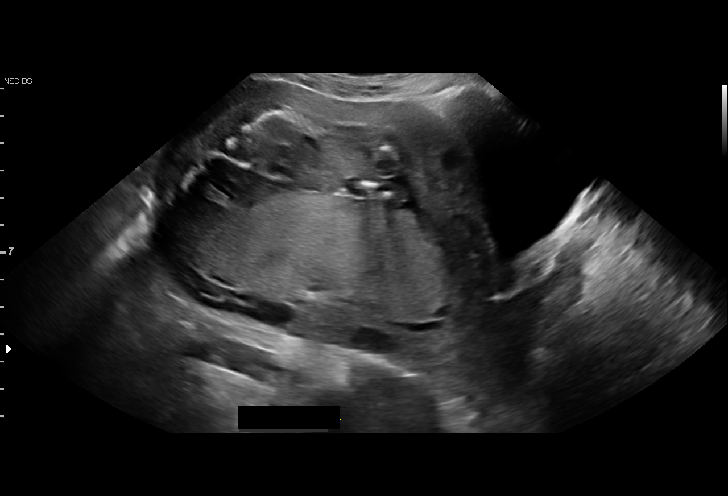
[im 2/18]
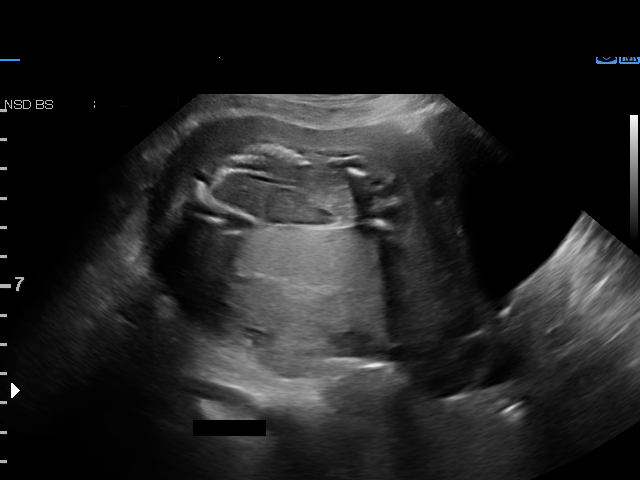
[im 4/18]
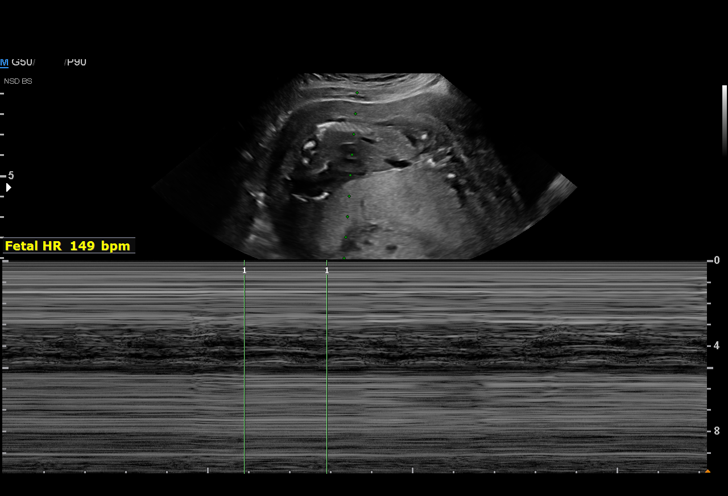
[im 5/18]
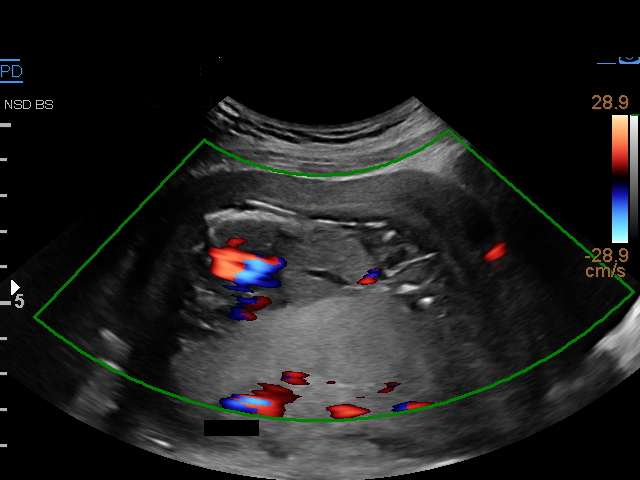
[im 6/18]
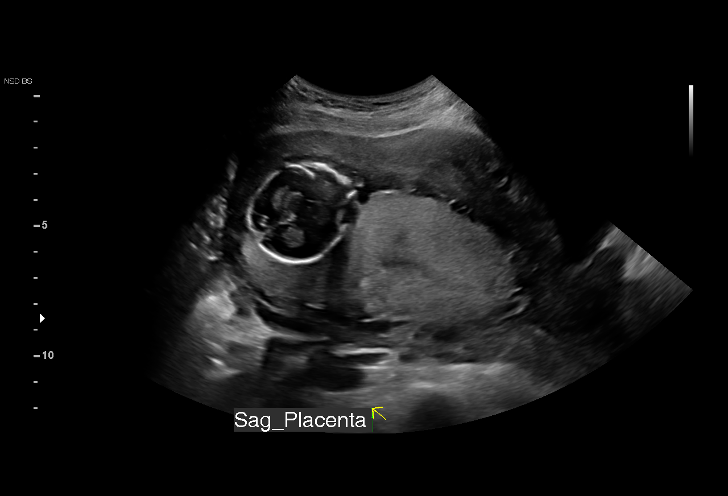
[im 7/18]
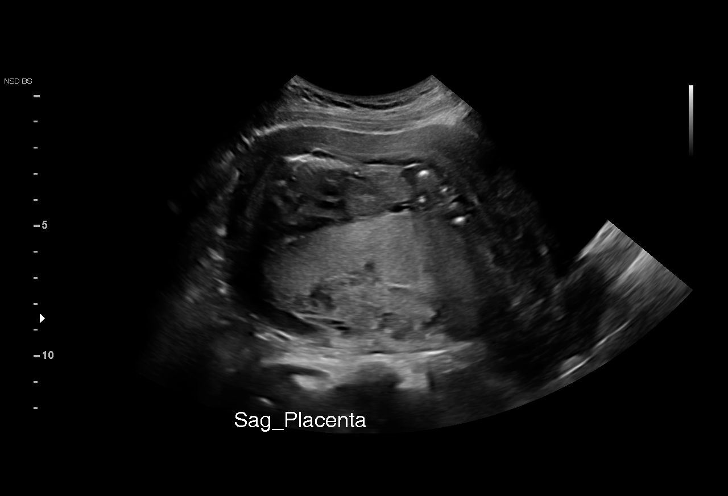
[im 8/18]
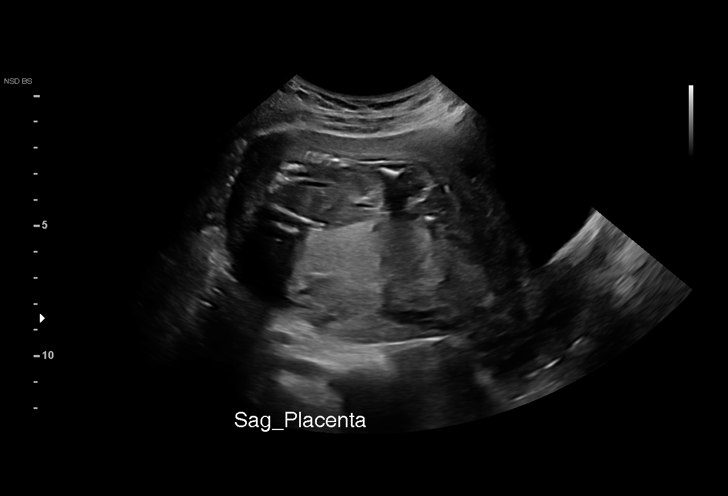
[im 10/18]
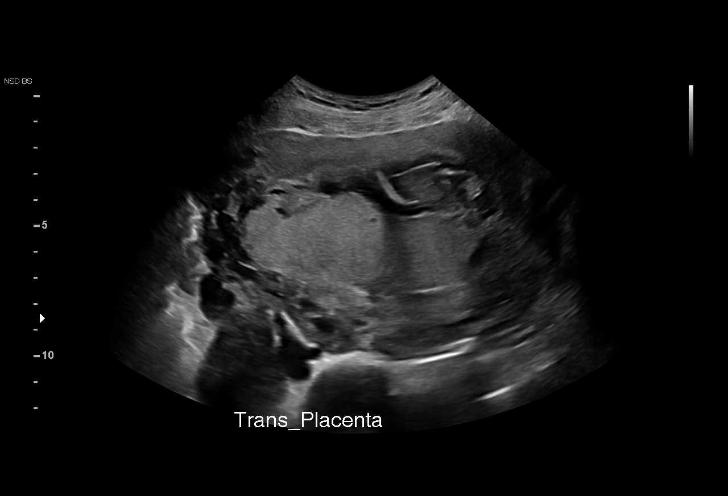
[im 11/18]
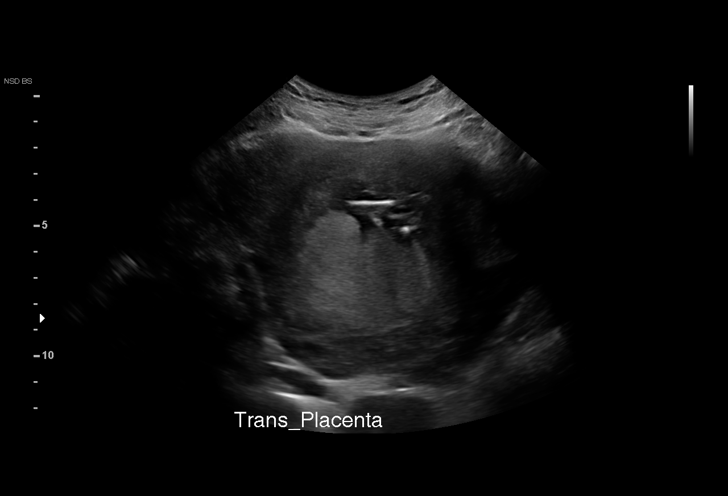
[im 12/18]
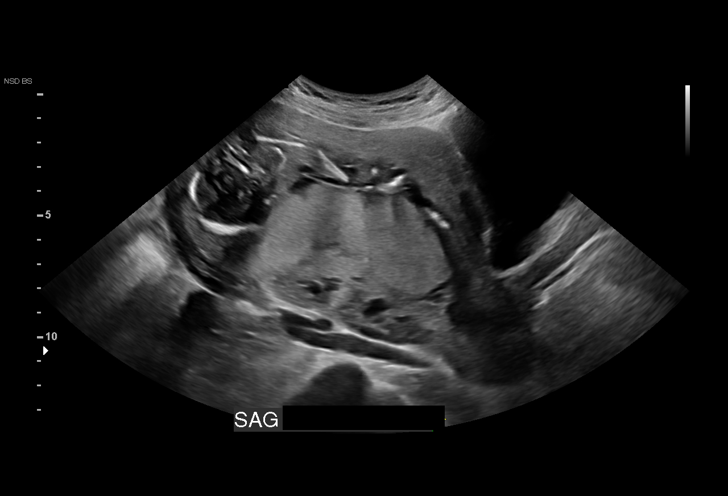
[im 13/18]
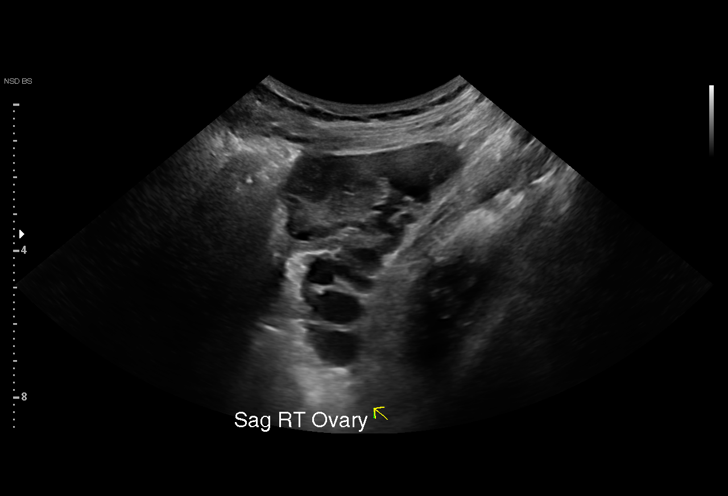
[im 14/18]
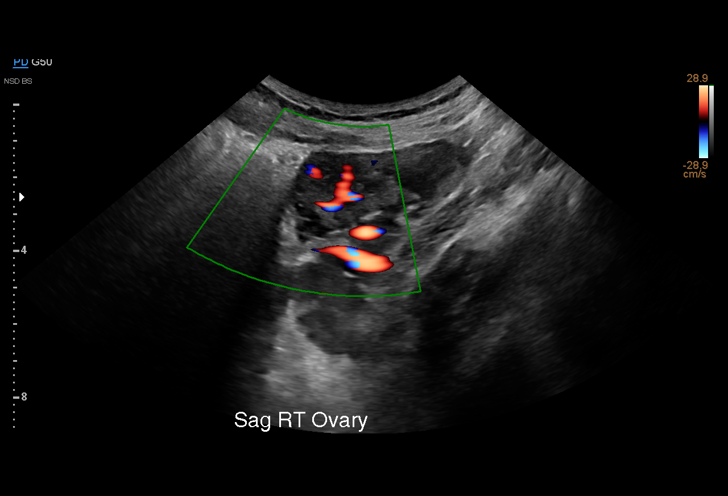
[im 16/18]
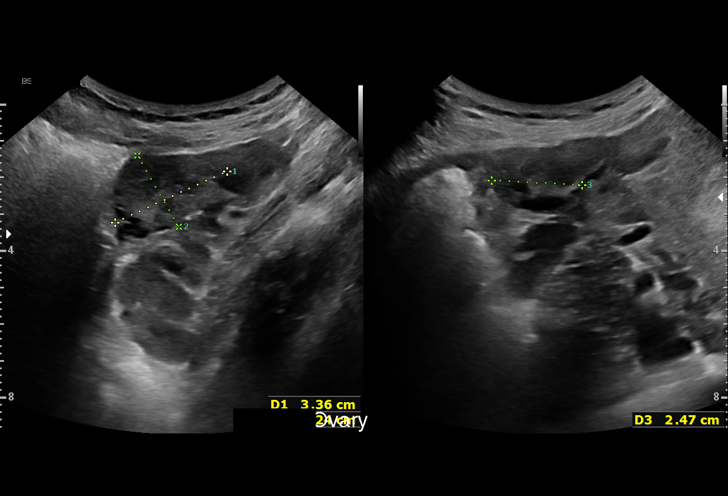
[im 17/18]
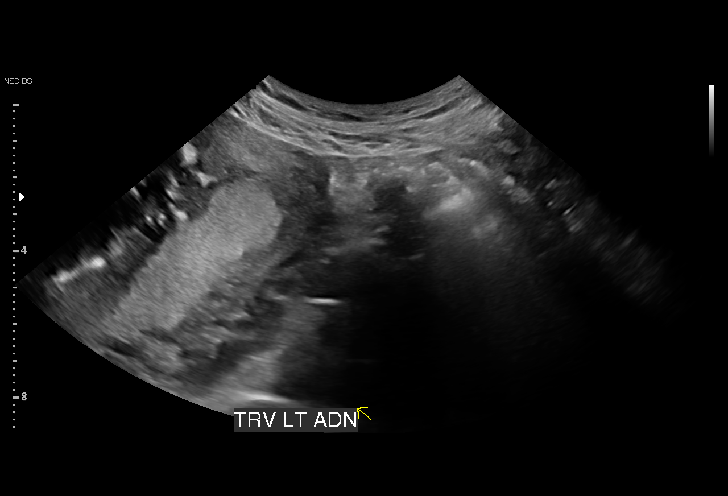
[im 18/18]
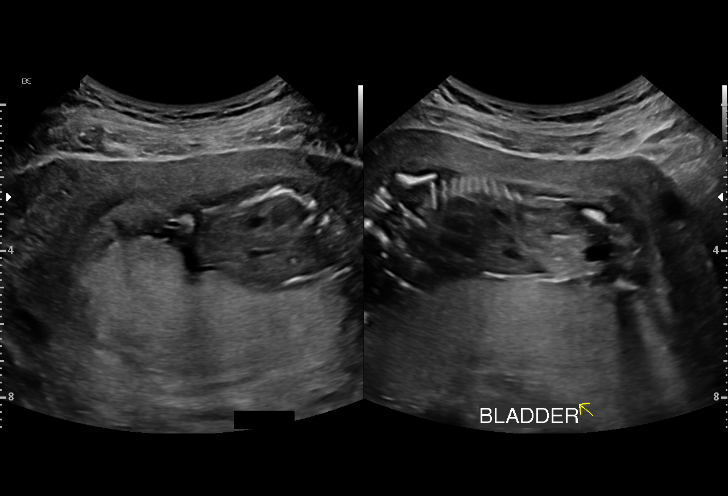

[15 of 18 positions shown; findings below may reference images not displayed]

[facility] [HOSPITAL]
 Referred By:      [facility]          [facility]
                   [facility]                             [HOSPITAL]

 1  US MFM OB LIMITED                     [address].01    [facility]

Indications

 Premature rupture of membranes - leaking       [XE]
 fluid (Positive Amnisure)
 16 weeks gestation of pregnancy
Fetal Evaluation

 Num Of Fetuses:         1
 Fetal Heart Rate(bpm):  149
 Cardiac Activity:       Observed
 Presentation:           Breech
 Placenta:               Posterior Previa
 P. Cord Insertion:      Not well visualized

 Amniotic Fluid
 AFI FV:      Anhydramnios
OB History

 Gravidity:    2         Term:   1        Prem:   0        SAB:   0
 TOP:          0       Ectopic:  0        Living: 1
Gestational Age

 Clinical EDD:  16w 0d                                        EDD:   [DATE]
 Best:          16w 0d     Det. By:  Clinical EDD             EDD:   [DATE]
Cervix Uterus Adnexa

 Cervix
 Normal appearance by transabdominal scan.
 Right Ovary
 Within normal limits.

 Left Ovary
 Not visualized.

 Adnexa
 No abnormality visualized.
Impression

 Patient was evaluated at the [facility] for c/o vaginal discharge.
 Her pregnancy is well-dated at her office (?early ultrasound).

 A limited ultrasound study was performed . Anhydramnios is
 seen with no measurable pocket of amniotic fluid. Placenta
 could be low-lying or previa.
Recommendations

 -If patient continues her pregnancy, I recommend detailed
 ultrasound and MFM consultation in 3 weeks.
                 [facility]

## 2021-06-08 NOTE — MAU Provider Note (Signed)
Patient [name] [name] is  40 y.o.  G2P1000  At [redacted]w[redacted]d here with complaints of leaking of fluid that started at 3:30 am on Saturday morning, July 9th (about 20 hours ago).  She endorses some light cramping; reports light vaginal spotting. She denies fever, SOB. She reports that she has been having on and off again discharge that is watery, sometimes with speckles in it, and sometimes with blood.   She has had an uncomplicated pregnancy; last birth was 7 years ago with [facility].  Last intercourse was 5 days ago; she had a chiropracter adjustment on Wednesday and wonders if she somehow caused her water to break.  History     CSN: [id number]  Arrival date and time: 06/08/21 8338   Event Date/Time   First Provider Initiated Contact with Patient 06/08/21 0123      Chief Complaint  Patient presents with   Vaginal Discharge    Liquid discharge-   Vaginal Discharge The patient's primary symptoms include vaginal discharge. The patient's pertinent negatives include no genital itching. The current episode started yesterday. The problem occurs 2 to 4 times per day. The problem has been gradually worsening. The patient is experiencing no pain. The vaginal discharge was milky, grey, watery, white and thin. There has been no bleeding. She has not been passing clots. She has not been passing tissue.  The patient reports that she had to change her clothes during the day and that her pad was soaked with clear water and that it kept running down her leg during the day. She called [facility] in the morning to discuss; see telephone notes in patient's [facility] chart for details.  OB History     Gravida  2   Para  1   Term  1   Preterm      AB      Living         SAB      IAB      Ectopic      Multiple      Live Births              No past medical history on file.   No family history on file.     Allergies: No Known Allergies  No medications prior to admission.    Review of Systems   Constitutional: Negative.   HENT: Negative.    Respiratory: Negative.    Cardiovascular: Negative.   Genitourinary:  Positive for vaginal discharge.  Neurological: Negative.   Hematological: Negative.   Physical Exam   Blood pressure 99/66, pulse 87, temperature 98.1 F (36.7 C), temperature source Oral, resp. rate 17, height 5\' 4"  (1.626 m), weight 57.8 kg, SpO2 100 %.  Physical Exam Constitutional:      Appearance: Normal appearance.  Abdominal:     General: Abdomen is flat.  Genitourinary:    General: Normal vulva.     Vagina: Vaginal discharge present.     Comments: NEFG; no blood in vagina, trace gray/yellow discharge, no pooling. Bimanual and cervical exam not done Musculoskeletal:        General: Normal range of motion.  Skin:    General: Skin is warm and dry.  Neurological:     Mental Status: She is alert.  Psychiatric:        Mood and Affect: Mood normal.    MAU Course  Procedures  MDM -With sterile speculum, questionable PPROM so amnisure collected> positive Patient given results and is grieving, she has many  questions about next steps. Will do [place] to check fluid level -US shows anhydramnios; fetal heart beat is present -patient's vitals are stable, she is afebrile, endorses some vaginal bleeding but is able to rest in MAU  Assessment and Plan   1. Anhydramnios in second trimester, single or unspecified fetus    -Blood type on file per patient is O pos -long discussion with patient on options; patient would like to do expectant management and follow up at [facility] this week. [place], CNM at the bedside before discharge to discuss follow-up as well. Linda from [facility] will be calling patient to schedule follow up with Chip Boer.  -warning signs of infection, pain, foul-smelling discharge, feeling like something is coming out as reasons to return to hospital. Patient and husband are teary but appreciate the care given.  -Dr. Connye Burkitt updated via telephone call.   [name] [name] 06/08/2021, 8:35 AM

## 2021-06-08 NOTE — MAU Note (Signed)
Reports at 0330 had clear liquid discharge running down leg- concerned that her water is broken or she may possibly have BV. Denies any foul odor. Changing panty liner every 30 minutes. No bright blood or bleeding. Denies uterine cramping or discomfort.

## 2021-06-09 ENCOUNTER — Encounter (HOSPITAL_COMMUNITY): Payer: Self-pay

## 2021-06-09 LAB — GC/CHLAMYDIA PROBE AMP (~~LOC~~) NOT AT ARMC
Chlamydia: NEGATIVE
Comment: NEGATIVE
Comment: NORMAL
Neisseria Gonorrhea: NEGATIVE

## 2021-06-12 ENCOUNTER — Other Ambulatory Visit: Payer: Self-pay

## 2021-06-12 ENCOUNTER — Encounter (HOSPITAL_COMMUNITY): Payer: Self-pay | Admitting: Obstetrics and Gynecology

## 2021-06-12 ENCOUNTER — Inpatient Hospital Stay (HOSPITAL_COMMUNITY)
Admission: AD | Admit: 2021-06-12 | Discharge: 2021-06-21 | DRG: 806 | Disposition: A | Discharge status: Home / Self Care | Payer: BC Managed Care – PPO | Attending: Obstetrics & Gynecology | Admitting: Obstetrics & Gynecology

## 2021-06-12 DIAGNOSIS — Z3A17 17 weeks gestation of pregnancy: Secondary | ICD-10-CM | POA: Diagnosis not present

## 2021-06-12 DIAGNOSIS — O021 Missed abortion: Principal | ICD-10-CM | POA: Diagnosis present

## 2021-06-12 DIAGNOSIS — O4422 Partial placenta previa NOS or without hemorrhage, second trimester: Secondary | ICD-10-CM | POA: Diagnosis present

## 2021-06-12 DIAGNOSIS — O4420 Partial placenta previa NOS or without hemorrhage, unspecified trimester: Secondary | ICD-10-CM | POA: Diagnosis present

## 2021-06-12 DIAGNOSIS — O42912 Preterm premature rupture of membranes, unspecified as to length of time between rupture and onset of labor, second trimester: Secondary | ICD-10-CM | POA: Diagnosis present

## 2021-06-12 DIAGNOSIS — O4692 Antepartum hemorrhage, unspecified, second trimester: Secondary | ICD-10-CM | POA: Diagnosis not present

## 2021-06-12 DIAGNOSIS — Z20822 Contact with and (suspected) exposure to covid-19: Secondary | ICD-10-CM | POA: Diagnosis present

## 2021-06-12 DIAGNOSIS — Z3A16 16 weeks gestation of pregnancy: Secondary | ICD-10-CM | POA: Diagnosis not present

## 2021-06-12 DIAGNOSIS — O209 Hemorrhage in early pregnancy, unspecified: Secondary | ICD-10-CM | POA: Diagnosis not present

## 2021-06-12 DIAGNOSIS — O42919 Preterm premature rupture of membranes, unspecified as to length of time between rupture and onset of labor, unspecified trimester: Secondary | ICD-10-CM | POA: Diagnosis present

## 2021-06-12 LAB — COMPREHENSIVE METABOLIC PANEL
ALT: 52 U/L — ABNORMAL HIGH (ref 0–44)
AST: 46 U/L — ABNORMAL HIGH (ref 15–41)
Albumin: 3.2 g/dL — ABNORMAL LOW (ref 3.5–5.0)
Alkaline Phosphatase: 106 U/L (ref 38–126)
Anion gap: 10 (ref 5–15)
BUN: 9 mg/dL (ref 6–20)
CO2: 21 mmol/L — ABNORMAL LOW (ref 22–32)
Calcium: 8.9 mg/dL (ref 8.9–10.3)
Chloride: 99 mmol/L (ref 98–111)
Creatinine, Ser: 0.59 mg/dL (ref 0.44–1.00)
GFR, Estimated: >60 mL/min (ref >60)
Glucose, Bld: 73 mg/dL (ref 70–99)
Potassium: 3.7 mmol/L (ref 3.5–5.1)
Sodium: 130 mmol/L — ABNORMAL LOW (ref 135–145)
Total Bilirubin: 0.4 mg/dL (ref 0.3–1.2)
Total Protein: 7.2 g/dL (ref 6.5–8.1)

## 2021-06-12 LAB — CBC
HCT: 40.7 % (ref 36.0–46.0)
Hemoglobin: 14.1 g/dL (ref 12.0–15.0)
MCH: 32.7 pg (ref 26.0–34.0)
MCHC: 34.6 g/dL (ref 30.0–36.0)
MCV: 94.4 fL (ref 80.0–100.0)
Platelets: 182 10*3/uL (ref 150–400)
RBC: 4.31 MIL/uL (ref 3.87–5.11)
RDW: 13.4 % (ref 11.5–15.5)
WBC: 14 10*3/uL — ABNORMAL HIGH (ref 4.0–10.5)
nRBC: 0 % (ref 0.0–0.2)

## 2021-06-12 LAB — TYPE AND SCREEN
ABO/RH(D): O POS
Antibody Screen: NEGATIVE

## 2021-06-12 LAB — RESP PANEL BY RT-PCR (FLU A&B, COVID) ARPGX2
Influenza A by PCR: NEGATIVE
Influenza B by PCR: NEGATIVE
SARS Coronavirus 2 by RT PCR: NEGATIVE

## 2021-06-12 MED ORDER — PRENATAL MULTIVITAMIN CH
1.0000 | ORAL_TABLET | Freq: Every day | ORAL | Status: DC
  Filled 2021-06-12: qty 1

## 2021-06-12 MED ORDER — ACETAMINOPHEN 325 MG PO TABS: 650.0000 mg | ORAL_TABLET | ORAL | Status: DC | PRN

## 2021-06-12 MED ORDER — DOCUSATE SODIUM 100 MG PO CAPS
100.0000 mg | ORAL_CAPSULE | Freq: Every day | ORAL | Status: DC
  Filled 2021-06-12 (×2): qty 1

## 2021-06-12 MED ORDER — ZOLPIDEM TARTRATE 5 MG PO TABS
5.0000 mg | ORAL_TABLET | Freq: Every evening | ORAL | Status: DC | PRN
  Administered 2021-06-12 – 2021-06-16 (×5): 5 mg via ORAL
  Filled 2021-06-12 (×5): qty 1

## 2021-06-12 MED ORDER — CALCIUM CARBONATE ANTACID 500 MG PO CHEW: 2.0000 | CHEWABLE_TABLET | ORAL | Status: DC | PRN

## 2021-06-12 NOTE — MAU Provider Note (Signed)
Patient [name] [name] is a 40 y.o. G3P2002  At [redacted]w[redacted]d here with complaints of vaginal discharge and seeing something hanging out of her vagina. She is confirmed PPROM as of 06/07/2021 per patient report and diagnosed with anhydramnios on July 10.  Counseling about various outcomes and options was given. She was discharged home with close follow-up; was seen at [facility] on 7[facility]11.  History     CSN: [id number]  Arrival date and time: 06/12/21 1612   None     Chief Complaint  Patient presents with   Vaginal Discharge   Vaginal Discharge The patient's primary symptoms include vaginal discharge. The current episode started in the past 7 days. The problem occurs constantly. She is pregnant. Pertinent negatives include no back pain, constipation, diarrhea, dysuria, urgency or vomiting.   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    Past Surgical History:  Procedure Laterality Date   WISDOM TOOTH EXTRACTION  2002    Family History  Problem Relation Age of Onset   Depression Mother    Hyperlipidemia Mother    Hypertension Mother    Hyperlipidemia Father    Hypertension Father    Cancer Maternal Grandmother        BREAST   Stroke Maternal Grandmother    Dementia Maternal Grandmother    Diabetes Maternal Grandmother    Cancer Maternal Grandfather    Heart disease Maternal Grandfather    Down syndrome Other    Cancer Other 73       BREAST    Social History   Tobacco Use   Smokeless tobacco: Never  Substance Use Topics   Alcohol use: No    Alcohol/week: 1.0 - 2.0 standard drink    Types: 1 - 2 drink(s) per week   Drug use: No    Allergies: No Known Allergies  Medications Prior to  Admission  Medication Sig Dispense Refill Last Dose   Ascorbic Acid (VITAMIN C PO) Take 1 tablet by mouth daily. 1000 mg      Cholecalciferol (VITAMIN D PO) Take 1 capsule by mouth daily. 5000 mg      Cranberry 500 MG CAPS Take 1 capsule by mouth daily.      D-MANNOSE PO Take 1 tablet by mouth daily.      Miconazole POWD Apply topically as needed. After breast feedings      NON FORMULARY Juice plus capsules 4 each ultra, garden, & orchard daily      Nutritional Supplements (JUICE PLUS FIBRE PO) Take by mouth daily. This is a shake       Review of Systems  Constitutional: Negative.   HENT: Negative.    Respiratory: Negative.    Gastrointestinal:  Negative for constipation, diarrhea and vomiting.  Genitourinary:  Positive for vaginal discharge. Negative for dysuria and urgency.  Musculoskeletal:  Negative for back pain.  Neurological: Negative.   Physical Exam   Blood pressure 125/72, pulse 100, temperature  99.5 F (37.5 C), resp. rate 12, SpO2 100 %.  Physical Exam Constitutional:      Appearance: Normal appearance.  Genitourinary:    General: Normal vulva.  Musculoskeletal:        General: Normal range of motion.  Skin:    General: Skin is warm.  Neurological:     Mental Status: She is alert.  Visible coil of cord is present at the introitus, heavy white discharge with no odor or color noted.   MAU Course  Procedures  MDM -[facility] is 190 -Discussed with Dr. Alvester Morin, who offers admission and IOL -[place] at the bedside to discuss options with patient  Assessment and Plan    [name] [name] [facility] 06/12/2021, 6:19 PM

## 2021-06-12 NOTE — Progress Notes (Signed)
Pt requested for staff to respect sleep. Patient states she spoke with provider earlier about the placement of a Do Not Disturb sign on the door. At this time pt VS are stable, she is free of pain, bleeding, and LOF has not increased. Patient instructed to call for any changes in condition. Sign has been placed on the door.

## 2021-06-12 NOTE — H&P (Addendum)
[name] [name] is a 40 y.o. female, G2P1001, IUP at 16.4 weeks, presenting for unbiblical cord noted at vaginal introgenous at home while using the bathroom. Pt PPROM'ed clear fluid on  7/10, amnisure+, g/gc-, wet prep-, UA-. Declined genetic testing during this pregnancy, plan for PP genetic testing. Baby Name is [name] [name]. FHT noted with doppler to be 190. Pt endorses still having vaginal leakage.  Denies vaginal bleeding. Denies feeling cxt's.    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 01/06/2013   Vasculitis (HCC) 02/03/2013   History of recurrent UTI (urinary tract infection) 02/03/2013   NSVD (normal spontaneous vaginal delivery) 07/24/2013   Second-degree perineal laceration, with delivery 07/24/2013   Anemia 07/24/2013   Sacral back pain 05/24/2020   Resolved Ambulatory Problems    Diagnosis Date Noted   Active labor at term 07/23/2013   Past Medical History:  Diagnosis Date   Capillary fragility (HCC) 2013   Hives 2013   Infection    Schamberg's disease 06/2012      Medications Prior to Admission  Medication Sig Dispense Refill Last Dose   Ascorbic Acid (VITAMIN C PO) Take 1 tablet by mouth daily. 1000 mg      Cholecalciferol (VITAMIN D PO) Take 1 capsule by mouth daily. 5000 mg      Cranberry 500 MG CAPS Take 1 capsule by mouth daily.      D-MANNOSE PO Take 1 tablet by mouth daily.      Miconazole POWD Apply topically as needed. After breast feedings      NON  FORMULARY Juice plus capsules 4 each ultra, garden, & orchard daily      Nutritional Supplements (JUICE PLUS FIBRE PO) Take by mouth daily. This is a shake       Past Medical History:  Diagnosis Date   Capillary fragility (HCC) 2013   Hives 2013   Infection    HX FREQUENT UTI'S   Schamberg's disease 06/2012     No current facility-administered medications on file prior to encounter.   Current Outpatient Medications on File Prior to Encounter  Medication Sig Dispense Refill   Ascorbic Acid (VITAMIN C PO) Take 1 tablet by mouth daily. 1000 mg     Cholecalciferol (VITAMIN D PO) Take 1 capsule by mouth daily. 5000 mg     Cranberry 500 MG CAPS Take 1 capsule by mouth daily.     D-MANNOSE PO Take 1 tablet by mouth daily.     Miconazole POWD Apply topically as needed. After breast feedings     NON FORMULARY Juice plus capsules 4 each ultra, garden, & orchard daily     Nutritional Supplements (JUICE PLUS FIBRE PO) Take by mouth daily. This is a shake       No Known Allergies    OB History     Gravida  3   Para  2   Term  2   Preterm  0   AB  0   Living  1      SAB  0   IAB  0   Ectopic  0   Multiple      Live Births  1          Past Medical History:  Diagnosis Date   Capillary fragility (HCC) 2013   Hives 2013   Infection    HX FREQUENT UTI'S   Schamberg's disease 06/2012   Past Surgical History:  Procedure Laterality Date   WISDOM TOOTH EXTRACTION  2002   Family History: family history includes Cancer in her maternal grandfather and maternal grandmother; Cancer (age of onset: 41) in an other family member; Dementia in her maternal grandmother; Depression in her mother; Diabetes in her maternal grandmother; Down syndrome in an other family member; Heart disease in her maternal grandfather; Hyperlipidemia in her father and mother; Hypertension in her father and mother; Stroke in her maternal grandmother. Social History:  reports that she does not have a  smoking history on file. She has never used smokeless tobacco. She reports that she does not drink alcohol and does not use drugs.   ROS:  Review of Systems  Constitutional: Negative.   HENT: Negative.    Respiratory: Negative.    Cardiovascular: Negative.   Gastrointestinal: Negative.   Genitourinary:        Vaginal leakage of clear fluids   Musculoskeletal: Negative.   Skin: Negative.   Neurological: Negative.   Endo/Heme/Allergies: Negative.   Psychiatric/Behavioral: Negative.         Appropriate for situation, teary eyed.     Physical Exam: BP 125/72 (BP Location: Right Arm)   Pulse 100   Temp 99.5 F (37.5 C)   Resp 12   SpO2 100%   Physical Exam Vitals and nursing note reviewed. Exam conducted with a chaperone present.  Constitutional:      Appearance: Normal appearance.  HENT:     Head: Normocephalic and atraumatic.     Nose: Nose normal.     Mouth/Throat:     Mouth: Mucous membranes are moist.  Eyes:     Conjunctiva/sclera: Conjunctivae normal.     Pupils: Pupils are equal, round, and reactive to light.  Cardiovascular:     Rate and Rhythm: Normal rate and regular rhythm.     Pulses: Normal pulses.     Heart sounds: Normal heart sounds.  Pulmonary:     Effort: Pulmonary effort is normal.     Breath sounds: Normal breath sounds.  Abdominal:     General: Bowel sounds are normal.  Genitourinary:    Comments: Umbilical cord noted at the vaginal introgenous, vaginal discharge noted cloudy, no bleeding noted, declined speculum and digital exam.  Musculoskeletal:        General: Normal range of motion.     Cervical back: Normal range of motion and neck supple.  Skin:    General: Skin is warm.     Capillary Refill: Capillary refill takes less than 2 seconds.  Neurological:     General: No focal deficit present.     Mental Status: She is alert.  Psychiatric:     Comments: Teary eyed.      FHT 190  Labs: No results found for this or any previous visit  (from the past 24 hour(s)).  Imaging:  [place] MFM OB Limited  Result Date: 06/08/2021 ----------------------------------------------------------------------  OBSTETRICS REPORT                       (Signed Final 06/08/2021 01:01 pm) ---------------------------------------------------------------------- Patient Info  ID #:       [id number]                          D.O.B.:  1981/06/15 (40 yrs)  Name:       FAIRY ASHLOCK Keeler              Visit Date: 06/08/2021 03:43 am ---------------------------------------------------------------------- Performed By  Attending:        Noralee Space MD        Ref. Address:     [address]                                                             [place]                                                             [place] [address]  Performed By:     Earley Brooke     Secondary Phy.:   [facility] MAU/Triage                    BS, RDMS  Referred By:      Nat Christen          [facility] ---------------------------------------------------------------------- Orders  #  Description                           Code        Ordered By  1  [place] MFM OB LIMITED                     63845.36    Luna Kitchens ----------------------------------------------------------------------  #  Order #                     Accession #                Episode #  1  [id number]                   [id number]                 [id number] ---------------------------------------------------------------------- Indications  Premature rupture of membranes - leaking       O42.90  fluid (Positive Amnisure)  [redacted] weeks gestation of pregnancy                Z3A.16 ---------------------------------------------------------------------- Fetal Evaluation  Num Of Fetuses:         1  Fetal Heart Rate(bpm):  149  Cardiac Activity:       Observed  Presentation:           Breech  Placenta:  Posterior Previa  P. Cord Insertion:       Not well visualized  Amniotic Fluid  AFI FV:      Anhydramnios ---------------------------------------------------------------------- OB History  Gravidity:    2         Term:   1        Prem:   0        SAB:   0  TOP:          0       Ectopic:  0        Living: 1 ---------------------------------------------------------------------- Gestational Age  Clinical EDD:  16w 0d                                        EDD:   11/23/21  Best:          16w 0d     Det. By:  Clinical EDD             EDD:   11/23/21 ---------------------------------------------------------------------- Cervix Uterus Adnexa  Cervix  Normal appearance by transabdominal scan.  Right Ovary  Within normal limits.  Left Ovary  Not visualized.  Adnexa  No abnormality visualized. ---------------------------------------------------------------------- Impression  Patient was evaluated at the MAU for c/o vaginal discharge.  Her pregnancy is well-dated at her office (?early ultrasound).  A limited ultrasound study was performed . Anhydramnios is  seen with no measurable pocket of amniotic fluid. Placenta  could be low-lying or previa. ---------------------------------------------------------------------- Recommendations  -If patient continues her pregnancy, I recommend detailed  ultrasound and MFM consultation in 3 weeks. ----------------------------------------------------------------------                  Noralee Space, MD Electronically Signed Final Report   06/08/2021 01:01 pm ----------------------------------------------------------------------   MAU Course: Orders Placed This Encounter  Procedures   Resp Panel by RT-PCR (Flu A&B, Covid) Nasopharyngeal Swab   CBC on admission   Comprehensive metabolic panel   Diet regular Room service appropriate? Yes; Fluid consistency: Thin   Notify physician (specify)   Vital signs   Defer vaginal exam for vaginal bleeding or PROM <37 weeks   Initiate Oral Care Protocol   Initiate Carrier Fluid Protocol    Assess fetal heart tones by doppler   Activity as tolerated   SCDs   Full code   Consult to Maternal Fetal Care   Airborne and Contact precautions   Type and screen  [facility]   Admit to Inpatient (patient's expected length of stay will be greater than 2 midnights or inpatient only procedure)   Meds ordered this encounter  Medications   acetaminophen (TYLENOL) tablet 650 mg   zolpidem (AMBIEN) tablet 5 mg   docusate sodium (COLACE) capsule 100 mg   calcium carbonate (TUMS - dosed in mg elemental calcium) chewable tablet 400 mg of elemental calcium   prenatal multivitamin tablet 1 tablet    Assessment/Plan: [name] [name] is a 40 y.o. female, G2P1001, IUP at 16.4 weeks, presenting for unbiblical cord noted at vaginal introgenous at home while using the bathroom. Pt PPROM'ed clear fluid on  7/10, amnisure+, g/gc-, wet prep-, UA-. Declined genetic testing during this pregnancy, plan for PP genetic testing. Baby Name is [place]. FHT noted with doppler to be 190. Pt endorses still having vaginal leakage.  Denies vaginal bleeding. Denies feeling cxt's.   Condolences offered, POC care reviewed. Discussed expectant  management versus IOL. Discussed see MFM in the morning.   Plan: Admit to [facility] per consult with DR Su Hilt Routine [facility] orders Diet regular Offer chaplin MFM consult 7/15 to review options and POC SCD CBC/CMP/Type screen GBS UC Doppler PRN or TID Desires anora post delivery.   Dale Tselakai Dezza NP-C, CNM, MSN 06/12/2021, 5:54 PM

## 2021-06-12 NOTE — MAU Note (Signed)
Pt reports she saw something hanging out of her vagina with a mirror. Pt reports it went back in and now is here with some vaginal discharge.

## 2021-06-13 DIAGNOSIS — O42912 Preterm premature rupture of membranes, unspecified as to length of time between rupture and onset of labor, second trimester: Secondary | ICD-10-CM | POA: Diagnosis not present

## 2021-06-13 DIAGNOSIS — Z3A16 16 weeks gestation of pregnancy: Secondary | ICD-10-CM | POA: Diagnosis not present

## 2021-06-13 MED ORDER — LACTATED RINGERS IV SOLN
INTRAVENOUS | Status: DC
  Administered 2021-06-13 – 2021-06-14 (×2): 125 mL/h via INTRAVENOUS

## 2021-06-13 NOTE — Progress Notes (Signed)
At 1920 patient requested to see Rhea Pink or Dr Dion Body.  Patient stated that she and significant other had made a decision and wanted to talk with them.  Rhea Pink, CNM was notified at [phone] and she came in to talk with patient at 37 and left at 2040.

## 2021-06-13 NOTE — Progress Notes (Addendum)
In to assess and discuss couple's decision. Pt reports she has talked to Dr. Grace Bushy (MFM) and she had a lengthy conversation regarding her options at this point.  She states that he reports she can go home if she desires.  Pt states she is not comfortable do that.  I have not talked to Dr. Grace Bushy yet and note pending.  Pt feels that bleeding has slowed.  Feels warm but denies fever or chills.  Requests vaginal exam to determine if fetal parts are in the vagina.  Pt denies pressure.   Pt states that they were supposed to have an ultrasound at the office today. Pt inquired if that could be done.  BP (!) 116/59   Pulse 94   Temp 98.2 F (36.8 C) (Oral)   Resp 16   Ht (P) 5\' 4"  (1.626 m)   SpO2 100%   BMI (P) 21.87 kg/m    Gen:  NAD, pt appears comfortable. Abd:  Nontender SVE:  No parts in the vault.  BBB with thin, soft, effaced cervix, feels dilated.    Exam was limited.  Quarter sized BRB on pad.  ? Cord anteriorly.  Ultrasound:  Head and body seen abdominally.  Cephalic presentation. Normal cardiac activity.  +Fetal movement. Posterior placenta confirmed by color doppler.  No amniotic fluid visualized.  Discussed findings with pt and FOB.  Discussed plan if pt discharged.  S/Sxs of chorioamnionitis reviewed.  Discussion with RN in room 1 hour.  Will discuss with Dr. .  Addendum: Discussed plan with Dr. Grace Bushy.  Note reviewed.  Will continue expectant management.  Pt provided a picture of an obvious umbilical cord.  It was uploaded to her Epic chart by CNM.  Pt was previously unsure if she was having uterine tenderness with palpation.No obvious pain on exam.  Reviewed signs of chorioamnionitis.

## 2021-06-13 NOTE — Progress Notes (Signed)
Requested to come to bedside as pt and spouse have determine a plan of care. Pt states that she would like to have a full night's sleep without interruption for vital signs, shower and reg diet for breakfast, a repeat ultrasound in the AM, and to have Cytotec started at approx 1000. Will discuss morning plan request with on-coming team. CNM at bedside for 55 min.   S: No change in bleeding or sensation of vaginal/rectal pressure. Denies abd tenderness and pain. Voiding w/o diff and reports having BM this afternoon.   O: Blood pressure (!) 116/58, pulse 93, temperature 98.1 F (36.7 C), temperature source Oral, resp. rate 16, height 5\' 4"  (1.626 m), weight 57.8 kg, SpO2 99 %.   VE: Deferred Abd: soft, gravid, non-tender GU: no vaginal bleeding  A/P: 40 y.o. G2P1001 [redacted]w[redacted]d PPROM Expectant management overnight  Discuss pt's requests w/ on-coming OB team  [redacted]w[redacted]d, MSN, CNM 06/13/2021 10:54 PM

## 2021-06-13 NOTE — Consult Note (Signed)
MFM Consultation  Date of Service 06/13/21 Reason for request: previable PPROM Requesting Provider: Geryl Rankins, MD  Ms. Hett is a 40 yo G2P1 who is seen due previable PPROM at the request of Dr. Geryl Rankins.  Ms. Hebdon has an EDD of 11/23/21 consistent with a 16 weeks ultrasound performed on 07/10. Today she is [redacted]w[redacted]d.  Briefly Ms. Maione experience PPROM for 6 days now and has now experience cord prolapse where Ms. Micciche has placed the cord back into her vagina. She was recently readmitted due to this change but has overall been stable, with exception of recent intermittent bleeding.  She denies s/sx of infection including uterine tenderness or contraction. Her vitals are overall stable.   She conveyed her understanding that this pregnancy will likely not survive to viability and that she has been offered induction vs expectant management. She was not certain what to do however she does desire to hold the child with a heart beat on her chest. She expressed that IOL, as she was told may be the best chance that she could accomplish this goal, however, she is not comfortable with medicine and therefore is likely to choose expectant management.  Vitals with BMI 06/13/2021 06/13/2021 06/13/2021  Height - - -  Weight - - -  BMI - - -  Systolic 106 116 84  Diastolic 55 59 58  Pulse 88 94 90   CBC Latest Ref Rng & Units 06/12/2021 06/26/2014 09/29/2013  WBC 4.0 - 10.5 K/uL 14.0(H) 6.5 7.2  Hemoglobin 12.0 - 15.0 g/dL 86.7 61.9 50.9  Hematocrit 36.0 - 46.0 % 40.7 37.7 42.2  Platelets 150 - 400 K/uL 182 199 182   CMP Latest Ref Rng & Units 06/12/2021 06/26/2014 09/29/2013  Glucose 70 - 99 mg/dL 73 83 32(I)  BUN 6 - 20 mg/dL 9 14 71.2  Creatinine 4.58 - 1.00 mg/dL 0.99 8.33 0.7  Sodium 825 - 145 mmol/L 130(L) 140 142  Potassium 3.5 - 5.1 mmol/L 3.7 4.7 4.3  Chloride 98 - 111 mmol/L 99 102 -  CO2 22 - 32 mmol/L 21(L) 28 26  Calcium 8.9 - 10.3 mg/dL 8.9 9.5 05.3  Total Protein 6.5 - 8.1 g/dL  7.2 7.6 9.7(Q)  Total Bilirubin 0.3 - 1.2 mg/dL 0.4 0.2 7.34  Alkaline Phos 38 - 126 U/L 106 107 116  AST 15 - 41 U/L 46(H) 14 33  ALT 0 - 44 U/L 52(H) 11 63(H)   I discussed with Ms Gillooly that typically in such cases delivery occurs in 48 hrs to 14 days. There are cases that can extend to 28 days.   We discussed that survival is most impacted by ascending infection, early gestational age, and pulmonary hypoplasia if a fetus makes it to the age of viability. She expressed that she does not desire to remain in this state until 28 weeks.  We discussed that if expectant management is desired and if the patient is reliable we offer the patient to go home with intensive outpatient monitoring. She expressed that she would not want to go home like this and would be afraid. I explained that we don't have to make this decision today and the further evalation and observation is warranted.   At this time I recommend daily temperature checks and for delivery if signs or symptoms of infection present including maternal tachycardia, uterine tenderness, bleeding or fever.  Inpatient vs outpatient monitoring can be determine by she and her providers. If she eventually chooses outpatient monitoring counseling regarding  signs and symptoms of infection and bleeding should be discussed and daily temperature checks and 2-3 office visits per week.  Lastly, I expressed to Ms. Mcelrath that our goal and the goal of management is her safety and to help them achieve their values as they care for their family.  I spent 60 minutes with > 50% in face to face consultation.  All questions answered.  Novella Olive, MD.

## 2021-06-13 NOTE — Progress Notes (Signed)
Pt called out for RN to come evaluate fetal HR. FHR 175 via doppler. While in room, pt had mentioned that she had gotten up to the bathroom about 45 minutes ago and had noticed the umbilical cord was starting to come out again. Pt stated she pushed it back into the vagina and has not noticed anything unusual since then. Pt denies any pain, vaginal pressure, or bleeding. Pt requested RN to contact the midwife. Notified Rhea Pink, CNM. Maureen Ralphs stated her and Dr. Dion Body will come evaluate the pt this morning. Will continue to monitor.

## 2021-06-13 NOTE — Plan of Care (Signed)
  Problem: Education: Goal: Knowledge of General Education information will improve Description: Including pain rating scale, medication(s)/side effects and non-pharmacologic comfort measures Outcome: Progressing   Problem: Coping: Goal: Level of anxiety will decrease Outcome: Progressing   Problem: Education: Goal: Knowledge of disease or condition will improve Outcome: Progressing Goal: Knowledge of the prescribed therapeutic regimen will improve Outcome: Progressing   Problem: Activity: Goal: Risk for activity intolerance will decrease Outcome: Completed/Met   Problem: Nutrition: Goal: Adequate nutrition will be maintained Outcome: Completed/Met   Problem: Elimination: Goal: Will not experience complications related to urinary retention Outcome: Completed/Met

## 2021-06-13 NOTE — Progress Notes (Signed)
In room to discuss plan with pt and FOB.  Pt states that she felt the umbilical cord when she went to the bathroom and she started bleeding.  Pt denies contractions, abdominal pain or fever.  The couple is contemplating how to proceed b/c the baby currently has a heartbeat.  They would like to hold the baby while she is still alive so they are considering proceeding with induction.    Pt reports she can feel the blood coming out and understands this is different what she previously saw after the vaginal exam on admission.  We reviewed risks of remaining pregnant, including chorioamnionitis and then sepsis, hemorrhage.  They are hoping for a miracle but understand now with the bleeding that may be less likely.  They wanted to speak to MFM but Dr. Grace Bushy was contacted by Rhea Pink, CNM and was informed that he could not get here until the afternoon.    Pt shared that they have talked with their spiritual leaders and have a strong support system that are praying for them.  She read a prayer that she wrote for "[name]."  Temp:  [98.1 F (36.7 C)-99.5 F (37.5 C)] 98.1 F (36.7 C) (07/15 0737) Pulse Rate:  [84-101] 93 (07/15 0737) Resp:  [12-16] 16 (07/15 0737) BP: (112-125)/(59-72) 115/67 (07/15 0737) SpO2:  [100 %] 100 % (07/15 0737)   Gen:  NAD, comfortable, tearful. Abd:  gravid, No fundal tenderness Perinuem:  No active bleeding but pad with min-moderate BRB  CBC Latest Ref Rng & Units 06/12/2021 06/26/2014 09/29/2013  WBC 4.0 - 10.5 K/uL 14.0(H) 6.5 7.2  Hemoglobin 12.0 - 15.0 g/dL 22.9 79.8 92.1  Hematocrit 36.0 - 46.0 % 40.7 37.7 42.2  Platelets 150 - 400 K/uL 182 199 182     A/P  IUP 16 5/7 weeks, PPROM Inevitable ab Vaginal bleeding, due to impending miscarriage No s/sxs of chorioamnionitis  Lengthy discussion as above.  Upon my departure pt was going to use the restroom.  Possible SVE to determine if fetal parts are in the vagina, prn. If pt decides to proceed with  medical management of miscarriage, recommend Cytotec. Risk of bleeding reviewed. Will likely cancel MFM consult.  Rhea Pink, CNM and Victorino Dike, RN were present during this discussion.  All questions answered.

## 2021-06-14 ENCOUNTER — Inpatient Hospital Stay (HOSPITAL_BASED_OUTPATIENT_CLINIC_OR_DEPARTMENT_OTHER): Payer: BC Managed Care – PPO

## 2021-06-14 DIAGNOSIS — Z3A16 16 weeks gestation of pregnancy: Secondary | ICD-10-CM | POA: Diagnosis not present

## 2021-06-14 DIAGNOSIS — O42912 Preterm premature rupture of membranes, unspecified as to length of time between rupture and onset of labor, second trimester: Secondary | ICD-10-CM | POA: Diagnosis not present

## 2021-06-14 LAB — CBC
HCT: 33.9 % — ABNORMAL LOW (ref 36.0–46.0)
Hemoglobin: 11.7 g/dL — ABNORMAL LOW (ref 12.0–15.0)
MCH: 32.5 pg (ref 26.0–34.0)
MCHC: 34.5 g/dL (ref 30.0–36.0)
MCV: 94.2 fL (ref 80.0–100.0)
Platelets: 145 10*3/uL — ABNORMAL LOW (ref 150–400)
RBC: 3.6 MIL/uL — ABNORMAL LOW (ref 3.87–5.11)
RDW: 13.3 % (ref 11.5–15.5)
WBC: 11.5 10*3/uL — ABNORMAL HIGH (ref 4.0–10.5)
nRBC: 0 % (ref 0.0–0.2)

## 2021-06-14 LAB — CULTURE, OB URINE: Culture: NO GROWTH

## 2021-06-14 NOTE — Progress Notes (Addendum)
[name] [name] is a 41 y.o. female, G2P1001 IUP at 17.0 weeks Presented to MAU for umbilical cord noted in vagina while at home, Pt replaced in vagina herself Pt PPROM'ed clear fluid on  7/10 Amnisure+, g/gc-, wet prep-, UA-.  Declined genetic testing during this pregnancy, plan for PP genetic testing.  Baby Name is [place].  Denies contractions, abdominal pain, fever, chills, LOF. Images reviewed of patients bleeding and pad counts Bleeding decreased to scant Limited US today with FHR in 190s, no amniotic fluid  S: Pt has no complaints and notes she "feels at complete peace" with any potential outcome due to her support group. She verbalizes understanding of all risks involved and multiple reiteration by all providers. She feels well supported and cared for and wants to reiterate they are well aware of all risks and well educated. They however are holding out for a true miracle in the event her status remains stable and a FHR is present  O: Blood pressure (!) 106/62, pulse 91, temperature 98.0 F temperature source Oral, resp. rate 18 height 5\' 4"  (1.626 m), weight 57.8 kg, SpO2 99 %.   VE: Deferred Abd: soft, gravid, non-tender GU: scant vaginal bleeding  A/P: 40 y.o. G2P1001 [redacted]w[redacted]d PPROM Expectant management overnight  CNM spent 1 hour with face to face discussion [redacted]w[redacted]d reviewed    [place], MSN, CNM 06/14/2021 3:43 PM  Patient seen and examined. Pt desires expectant management inpatient for now.  No signs or symptoms of chorio.

## 2021-06-15 ENCOUNTER — Encounter (HOSPITAL_COMMUNITY): Payer: Self-pay | Admitting: Obstetrics and Gynecology

## 2021-06-15 ENCOUNTER — Inpatient Hospital Stay (HOSPITAL_BASED_OUTPATIENT_CLINIC_OR_DEPARTMENT_OTHER): Payer: BC Managed Care – PPO

## 2021-06-15 DIAGNOSIS — O4692 Antepartum hemorrhage, unspecified, second trimester: Secondary | ICD-10-CM | POA: Diagnosis not present

## 2021-06-15 DIAGNOSIS — O42912 Preterm premature rupture of membranes, unspecified as to length of time between rupture and onset of labor, second trimester: Secondary | ICD-10-CM | POA: Diagnosis not present

## 2021-06-15 DIAGNOSIS — Z3A17 17 weeks gestation of pregnancy: Secondary | ICD-10-CM

## 2021-06-15 LAB — CBC WITH DIFFERENTIAL/PLATELET
Abs Immature Granulocytes: 0.11 10*3/uL — ABNORMAL HIGH (ref 0.00–0.07)
Basophils Absolute: 0 10*3/uL (ref 0.0–0.1)
Basophils Relative: 0 %
Eosinophils Absolute: 0 10*3/uL (ref 0.0–0.5)
Eosinophils Relative: 0 %
HCT: 33.4 % — ABNORMAL LOW (ref 36.0–46.0)
Hemoglobin: 11.7 g/dL — ABNORMAL LOW (ref 12.0–15.0)
Immature Granulocytes: 1 %
Lymphocytes Relative: 10 %
Lymphs Abs: 1 10*3/uL (ref 0.7–4.0)
MCH: 32.7 pg (ref 26.0–34.0)
MCHC: 35 g/dL (ref 30.0–36.0)
MCV: 93.3 fL (ref 80.0–100.0)
Monocytes Absolute: 1 10*3/uL (ref 0.1–1.0)
Monocytes Relative: 9 %
Neutro Abs: 8.2 10*3/uL — ABNORMAL HIGH (ref 1.7–7.7)
Neutrophils Relative %: 80 %
Platelets: 149 10*3/uL — ABNORMAL LOW (ref 150–400)
RBC: 3.58 MIL/uL — ABNORMAL LOW (ref 3.87–5.11)
RDW: 13.3 % (ref 11.5–15.5)
WBC: 10.4 10*3/uL (ref 4.0–10.5)
nRBC: 0 % (ref 0.0–0.2)

## 2021-06-15 NOTE — Progress Notes (Signed)
  [name] [name] is a 40 y.o. female, G2P1001 IUP at 17.0 weeks Presented to [facility]U for umbilical cord noted in vagina while at home, Pt replaced in vagina herself PPROM clear fluid on 7/10 Amnisure+, g/gc-, wet prep-, UA-. Declined genetic testing during this pregnancy, plan for PP genetic testing. Baby's Name is [name].  Denies contractions, abdominal pain, fever, chills, LOF, and no VB since 0900 on 7/16 per images on pt's phone taken of her pads w/ each void. VB has reduced to scant amounts of dark smears Limited US on 7/16 with FHR in 190s, no amniotic fluid   S: Very hope for a "miracle for [name]". Personal clergy came this morning for prayer and counsel. CNM reviewed CBC collected this AM. Pt and spouse aware of risks to report and denies fever, chills, and abd pain. Perceives abd as "getting larger". Request a repeat U/S for measurements to determine growth. Pt states the information will aid in their decision making. Pt desires to remain in-pt at this time. Continues to record audio and video of interactions w/ staff and providers. Pt shared social media post with this CNM of her prayer for "[name]" and the number of "likes" rec'vd for the post.   O: Blood pressure (!) 105/55, pulse 78, temperature 98 F (36.7 C), temperature source Oral, resp. rate 17, height 5\' 4"  (1.626 m), weight 57.8 kg, SpO2 99 %.  VE: Deferred Abd: soft, gravid, non-tender GU: scant, dark brown d/c FHR doppler 170  CBC Latest Ref Rng & Units 06/15/2021 06/14/2021 06/12/2021  WBC 4.0 - 10.5 K/uL 10.4 11.5(H) 14.0(H)  Hemoglobin 12.0 - 15.0 g/dL 11.7(L) 11.7(L) 14.1  Hematocrit 36.0 - 46.0 % 33.4(L) 33.9(L) 40.7  Platelets 150 - 400 K/uL 149(L) 145(L) 182   A/P: 40 y.o. G2P1001 [redacted]w[redacted]d PPROM Expectant management   [redacted]w[redacted]d, MSN, CNM 06/15/2021 2:11 PM

## 2021-06-16 DIAGNOSIS — O209 Hemorrhage in early pregnancy, unspecified: Secondary | ICD-10-CM | POA: Diagnosis not present

## 2021-06-16 DIAGNOSIS — O42919 Preterm premature rupture of membranes, unspecified as to length of time between rupture and onset of labor, unspecified trimester: Secondary | ICD-10-CM | POA: Diagnosis present

## 2021-06-16 DIAGNOSIS — O4420 Partial placenta previa NOS or without hemorrhage, unspecified trimester: Secondary | ICD-10-CM | POA: Diagnosis present

## 2021-06-16 LAB — CBC WITH DIFFERENTIAL/PLATELET
Abs Immature Granulocytes: 0.06 10*3/uL (ref 0.00–0.07)
Basophils Absolute: 0 10*3/uL (ref 0.0–0.1)
Basophils Relative: 0 %
Eosinophils Absolute: 0 10*3/uL (ref 0.0–0.5)
Eosinophils Relative: 0 %
HCT: 31.8 % — ABNORMAL LOW (ref 36.0–46.0)
Hemoglobin: 10.9 g/dL — ABNORMAL LOW (ref 12.0–15.0)
Immature Granulocytes: 1 %
Lymphocytes Relative: 11 %
Lymphs Abs: 1.2 10*3/uL (ref 0.7–4.0)
MCH: 32.3 pg (ref 26.0–34.0)
MCHC: 34.3 g/dL (ref 30.0–36.0)
MCV: 94.4 fL (ref 80.0–100.0)
Monocytes Absolute: 0.9 10*3/uL (ref 0.1–1.0)
Monocytes Relative: 8 %
Neutro Abs: 8.3 10*3/uL — ABNORMAL HIGH (ref 1.7–7.7)
Neutrophils Relative %: 80 %
Platelets: 163 10*3/uL (ref 150–400)
RBC: 3.37 MIL/uL — ABNORMAL LOW (ref 3.87–5.11)
RDW: 13.4 % (ref 11.5–15.5)
WBC: 10.4 10*3/uL (ref 4.0–10.5)
nRBC: 0 % (ref 0.0–0.2)

## 2021-06-16 NOTE — Progress Notes (Addendum)
Hospital day # 4 pregnancy at [redacted]w[redacted]d--[name] [name] is a 40 y.o. female, G2P1001, IUP admitted at 16.4 weeks, currently [redacted]w[redacted]d today, admitted for expectant management for previable PPROM 7/9-10.  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 Discussed and reviewed POC, pt aware of risk with infection, and blood loss, pt declines any changes and desires to continue with expectant management. I was called back to the room, for vaginal bleeding, pt and RN report blood clot large, then continue to bleed frank blood, pt denies dizziness, SOB, heart palpitation. Reviewed R/B/A of increased bleeding totally today , consulted with DR Sallye Ober, who recommended cytotec for IOL, digital exam to check for cervical dilation, and CBC, pt declined all except CBC, pt verbalized "I am fully aware of the risk and do not want to change my plan of care".         Perception of contractions: none       Vaginal bleeding: bright red and passing large clots       Vaginal discharge:  watery and no significant change  O: BP 117/70 (BP Location: Right Arm)   Pulse 98   Temp 98.3 F (36.8 C) (Oral)   Resp 16   Ht 5\' 4"  (1.626 m)   Wt 57.8 kg   SpO2 100%   BMI 21.87 kg/m       Fetal heart tones: 147      Contractions:   None      Uterus non-tender      Extremities: extremities normal, atraumatic, no cyanosis or edema and no significant edema and no signs of DVT     A: [name] [name] is a 40 y.o. female, G2P1001, IUP admitted at 16.4 weeks, currently [redacted]w[redacted]d today, presenting for unbiblical cord noted at vaginal introgenous at home while using the bathroom. Pt PPROM'ed clear fluid on  7/10, amnisure+, g/gc-, wet prep-, UA-. Declined genetic testing during this pregnancy, plan for PP genetic testing. Baby Name is [name] [name]. Aware or R/B/A of chorio, and bleeding precaution, pt declines interventions, desires expectant management. ALT-DIETMANNS performed on 7/17 cephalic, anhydramnios, growth consistent with gestational age, funneling noted at the cervix, cervical length 0.71cm. EFW 169 gm, 0.6oz, 30%. Posterior placenta with low to possible partial previa noted.  Active vaginal bleeding noted today. Blood loss (Blood clot 7/17 8/17, 7/18 blood clot 8/18, pad ). Pt hemodynamically stable and bleeding has stopped currently, CBC drop form 11.7-10.9. Pt declined interventions, wishes to  continue with expectant management.   Dr Sallye Ober updated and aware of pt status. DR Sallye Ober to assess at bedside today.   [facility] CNM, MSN 06/16/2021 4:52 PM MD Addendum: I saw and examined patient at bedside and agree with above findings, assessment and plan as outlined above by [facility] CNM, MSN.  I discussed with patient recommendations to proceed with induction of labor due to her bleeding and concern for her status. She declines recommendation for induction of labor and desires continued expectant  management.  All her questions were answered.  She would accept a blood transfusion if it was necessary to save her life.  Dr. Hoover Browns  06/16/2021.  1920.

## 2021-06-16 NOTE — Progress Notes (Signed)
CNM present for U/S. Pt informed of limitation of exam R/T anhydramnios and advised to avoid recording the sonographer and the scan.

## 2021-06-17 MED ORDER — POLYSACCHARIDE IRON COMPLEX 150 MG PO CAPS
150.0000 mg | ORAL_CAPSULE | Freq: Every day | ORAL | Status: DC
  Administered 2021-06-20 – 2021-06-21 (×2): 150 mg via ORAL
  Filled 2021-06-17 (×4): qty 1

## 2021-06-17 NOTE — Progress Notes (Signed)
Pt without complaints.  No leakage of fluid or VB.  Good FM  BP 111/63 (BP Location: Right Arm)   Pulse 91   Temp 98.3 F (36.8 C) (Oral)   Resp 15   Ht 5\' 4"  (1.626 m)   Wt 57.8 kg   SpO2 100%   BMI 21.87 kg/m   FHTS     Toco none  Pt in NAD CV RRR Lungs CTAB abd  Gravid soft and NT GU no vb EXt no calf tenderness Results for orders placed or performed during the hospital encounter of 06/12/21 (from the past 72 hour(s))  CBC with Differential/Platelet     Status: Abnormal   Collection Time: 06/15/21  8:32 AM  Result Value Ref Range   WBC 10.4 4.0 - 10.5 K/uL   RBC 3.58 (L) 3.87 - 5.11 MIL/uL   Hemoglobin 11.7 (L) 12.0 - 15.0 g/dL   HCT 06/17/21 (L) 95.1 - 88.4 %   MCV 93.3 80.0 - 100.0 fL   MCH 32.7 26.0 - 34.0 pg   MCHC 35.0 30.0 - 36.0 g/dL   RDW 16.6 06.3 - 01.6 %   Platelets 149 (L) 150 - 400 K/uL   nRBC 0.0 0.0 - 0.2 %   Neutrophils Relative % 80 %   Neutro Abs 8.2 (H) 1.7 - 7.7 K/uL   Lymphocytes Relative 10 %   Lymphs Abs 1.0 0.7 - 4.0 K/uL   Monocytes Relative 9 %   Monocytes Absolute 1.0 0.1 - 1.0 K/uL   Eosinophils Relative 0 %   Eosinophils Absolute 0.0 0.0 - 0.5 K/uL   Basophils Relative 0 %   Basophils Absolute 0.0 0.0 - 0.1 K/uL   Immature Granulocytes 1 %   Abs Immature Granulocytes 0.11 (H) 0.00 - 0.07 K/uL    Comment: Performed at [facility], [address] [place], [place]  CBC with Differential/Platelet     Status: Abnormal   Collection Time: 06/16/21  1:50 PM  Result Value Ref Range   WBC 10.4 4.0 - 10.5 K/uL   RBC 3.37 (L) 3.87 - 5.11 MIL/uL   Hemoglobin 10.9 (L) 12.0 - 15.0 g/dL   HCT 06/18/21 (L) 57.3 - 22.0 %   MCV 94.4 80.0 - 100.0 fL   MCH 32.3 26.0 - 34.0 pg   MCHC 34.3 30.0 - 36.0 g/dL   RDW 25.4 27.0 - 62.3 %   Platelets 163 150 - 400 K/uL   nRBC 0.0 0.0 - 0.2 %   Neutrophils Relative % 80 %   Neutro Abs 8.3 (H) 1.7 - 7.7 K/uL   Lymphocytes Relative 11 %   Lymphs Abs 1.2 0.7 - 4.0 K/uL   Monocytes Relative 8 %    Monocytes Absolute 0.9 0.1 - 1.0 K/uL   Eosinophils Relative 0 %   Eosinophils Absolute 0.0 0.0 - 0.5 K/uL   Basophils Relative 0 %   Basophils Absolute 0.0 0.0 - 0.1 K/uL   Immature Granulocytes 1 %   Abs Immature Granulocytes 0.06 0.00 - 0.07 K/uL    Comment: Performed at [facility], [address], [place], [place]    Assessment and Plan [redacted]w[redacted]d  PPROM Pt stable and bleeding has stoppped.  No s/s of infection Monitor closely

## 2021-06-18 NOTE — Progress Notes (Addendum)
[name] [name] 40 y.o. G2P1001 [redacted]w[redacted]d Admitted on 06/12/21 for PPROM  PPROM on 06/08/21 Hospital day #6  Subjective:    Continues to remain hopeful for the birth outcome for "[name] [name]". States vaginal bleeding has been scant x48 hrs, denies blood clots from vagina, abd pain or tenderness, chills, fever, contractions, and LOF. Pt requests to share an interaction she had with this CNM on social media. This CNM declines to have her image, voice, or name shared on social media. Pt shared videos of her care during an episode of vaginal bleeding. Pt states that she has asked staff members for permission prior to recording, videoing, and posting images. Pt informed that the providers of [facility] request that she not record interactions of conversations RE: medical care, interventions, and procedures. Pt's spouse was recording when this CNM was present.  Spoke w/ Herbert Seta, Nurse Manager RE: recording. CNM was given hospital policy to review.   Objective:    VS: BP (!) 105/58 (BP Location: Right Arm)   Pulse 94   Temp 98.5 F (36.9 C) (Oral)   Resp 16   Ht 5\' 4"  (1.626 m)   Wt 57.8 kg   SpO2 100%   BMI 21.87 kg/m  FHR doppler:  Membranes: PPROM 06/08/21     Assessment/Plan:   40 y.o. G2P1001 [redacted]w[redacted]d PPROM    -stable   -no S/S infection    -continue in-patient expectant management Consider discussing case w/ risk management.   [redacted]w[redacted]d MSN, CNM 06/18/2021 8:49 AM

## 2021-06-19 ENCOUNTER — Inpatient Hospital Stay (HOSPITAL_BASED_OUTPATIENT_CLINIC_OR_DEPARTMENT_OTHER): Payer: BC Managed Care – PPO

## 2021-06-19 DIAGNOSIS — O42912 Preterm premature rupture of membranes, unspecified as to length of time between rupture and onset of labor, second trimester: Secondary | ICD-10-CM

## 2021-06-19 DIAGNOSIS — Z3A17 17 weeks gestation of pregnancy: Secondary | ICD-10-CM

## 2021-06-19 MED ORDER — MISOPROSTOL 200 MCG PO TABS
400.0000 ug | ORAL_TABLET | Freq: Once | ORAL | Status: AC
  Administered 2021-06-19: 400 ug via VAGINAL
  Filled 2021-06-19: qty 2

## 2021-06-19 NOTE — Progress Notes (Addendum)
[name] [name] 40 y.o. [id number] [redacted]w[redacted]d Admitted for PPROM on 06/12/21 PPROM on 06/08/21 Hospital day #7  S:  Pt sleeping during AM rounds. No reports of bleeding overnight.   O: Blood pressure (!) 112/51, pulse 80, temperature 98.6 F (37 C), temperature source Oral, resp. rate 16, height 5\' 4"  (1.626 m), weight 57.8 kg, SpO2 100 %.  Doppler 160 BPM  A/P: 40 y.o. G2P1001 [redacted]w[redacted]d PPROM    -stable   -no S/S infection    -continue in-patient expectant management  Consider discussing case w/ risk management.

## 2021-06-19 NOTE — Progress Notes (Signed)
Initial Nutrition Assessment  DOCUMENTATION CODES:  Not applicable  INTERVENTION:  Regular Diet Pt may order double protein portions and snacks TID if she makes request when ordering meals   NUTRITION DIAGNOSIS:  Increased nutrient needs related to  (pregnancy and fetal growth requirements) as evidenced by  (17 weeks IUP).  GOAL:  Patient will meet greater than or equal to 90% of their needs, Weight gain  MONITOR:  Weight trends  REASON FOR ASSESSMENT:  Antenatal, LOS   ASSESSMENT:  17 3/7 weeks, adm w/ PROM. no prenatal weight. BMI 21.8. on Prenatal vits plus iron supplement   Diet Order:   Diet Order             Diet regular Room service appropriate? Yes; Fluid consistency: Thin  Diet effective now                   EUCATION NEEDS:   No education needs have been identified at this time  Skin:  Skin Assessment: Reviewed RN Assessment  Height:   Ht Readings from Last 1 Encounters:  06/13/21 5\' 4"  (1.626 m)    Weight:   Wt Readings from Last 1 Encounters:  06/13/21 57.8 kg    Ideal Body Weight:   120 lbs  BMI:  Body mass index is 21.87 kg/m.  Estimated Nutritional Needs:   Kcal:  1700-1900  Protein:  75-85 g  Fluid:  2 L

## 2021-06-19 NOTE — Progress Notes (Signed)
MD Progress Note  Patient ID: [name] [name], female   DOB: 07/04/1981, 40 y.o.   MRN: [id number]  Was called this afternoon by staff RN that the fetal hearbeat could not be obtained via doppler. A verbal order was given to obtain a limited beside ultrasound.  The ultrasound showed that there was a fetal demise and the patient and her spouse was made aware.   S: Patient notes that she has no pain, no bleeding, no fever or chills, she "feels fine".       She inquired about why she "feels great" but the fetus has passed away.   O: Vitals reviewed  General: alert awake and oriented x 3 NAD  Pelvic exam:  Patient declines cervical exam  Assessment: IUFD of 17 week fetus secondary to anhydramnios and PPPROM  Discussed with patient her options in management to include:  Expectant management - to wait to see if her body recognizes the demise and she may start contracting spontaneously and deliver.  I advised against this modality with patient as there is an inherent risk of maternal infection, sepsis, endometritis, and hemorrhage.   Medical induction - Discussed using PG1 Misoprostol to induce her labor with anticipation of svd in bed.  Patient is unsure as she strongly desires to have a "natural" course of labor and is against medical intervention  Surgical management via D&E - Discussed this option with patient and patient definitively stated she would not want surgery, especially since she would like to sit with the fetus intact.   Patient to discuss the above with her spouse and family and let us know her ultimate decision.   Naoma Diener Hesston Hitchens

## 2021-06-20 LAB — CBC
HCT: 29.7 % — ABNORMAL LOW (ref 36.0–46.0)
HCT: 27.1 % — ABNORMAL LOW (ref 36.0–46.0)
HCT: 22.1 % — ABNORMAL LOW (ref 36.0–46.0)
Hemoglobin: 10.5 g/dL — ABNORMAL LOW (ref 12.0–15.0)
Hemoglobin: 9.3 g/dL — ABNORMAL LOW (ref 12.0–15.0)
Hemoglobin: 7.6 g/dL — ABNORMAL LOW (ref 12.0–15.0)
MCH: 32.9 pg (ref 26.0–34.0)
MCH: 32.6 pg (ref 26.0–34.0)
MCH: 32.3 pg (ref 26.0–34.0)
MCHC: 35.4 g/dL (ref 30.0–36.0)
MCHC: 34.3 g/dL (ref 30.0–36.0)
MCHC: 34.4 g/dL (ref 30.0–36.0)
MCV: 93.1 fL (ref 80.0–100.0)
MCV: 95.1 fL (ref 80.0–100.0)
MCV: 94 fL (ref 80.0–100.0)
Platelets: 183 10*3/uL (ref 150–400)
Platelets: 248 10*3/uL (ref 150–400)
Platelets: 179 10*3/uL (ref 150–400)
RBC: 3.19 MIL/uL — ABNORMAL LOW (ref 3.87–5.11)
RBC: 2.85 MIL/uL — ABNORMAL LOW (ref 3.87–5.11)
RBC: 2.35 MIL/uL — ABNORMAL LOW (ref 3.87–5.11)
RDW: 13.5 % (ref 11.5–15.5)
RDW: 13.7 % (ref 11.5–15.5)
RDW: 13.6 % (ref 11.5–15.5)
WBC: 8.9 10*3/uL (ref 4.0–10.5)
WBC: 11.3 10*3/uL — ABNORMAL HIGH (ref 4.0–10.5)
WBC: 10.1 10*3/uL (ref 4.0–10.5)
nRBC: 0 % (ref 0.0–0.2)
nRBC: 0 % (ref 0.0–0.2)
nRBC: 0 % (ref 0.0–0.2)

## 2021-06-20 LAB — TYPE AND SCREEN
ABO/RH(D): O POS
Antibody Screen: NEGATIVE

## 2021-06-20 MED ORDER — MISOPROSTOL 200 MCG PO TABS: 600.0000 ug | ORAL_TABLET | Freq: Once | ORAL | Status: DC

## 2021-06-20 MED ORDER — FENTANYL CITRATE (PF) 100 MCG/2ML IJ SOLN
INTRAMUSCULAR | Status: AC
  Filled 2021-06-20: qty 4

## 2021-06-20 MED ORDER — PRENATAL MULTIVITAMIN CH: 1.0000 | ORAL_TABLET | Freq: Every day | ORAL | Status: DC

## 2021-06-20 MED ORDER — SENNOSIDES-DOCUSATE SODIUM 8.6-50 MG PO TABS
2.0000 | ORAL_TABLET | ORAL | Status: DC
  Filled 2021-06-20 (×2): qty 2

## 2021-06-20 MED ORDER — ONDANSETRON HCL 4 MG/2ML IJ SOLN: 4.0000 mg | INTRAMUSCULAR | Status: DC | PRN

## 2021-06-20 MED ORDER — TRANEXAMIC ACID-NACL 1000-0.7 MG/100ML-% IV SOLN
INTRAVENOUS | Status: AC
  Administered 2021-06-20: 1000 mg via INTRAVENOUS
  Filled 2021-06-20: qty 100

## 2021-06-20 MED ORDER — METHYLERGONOVINE MALEATE 0.2 MG/ML IJ SOLN
0.2000 mg | Freq: Once | INTRAMUSCULAR | Status: AC
  Administered 2021-06-20: 0.2 mg via INTRAMUSCULAR

## 2021-06-20 MED ORDER — CLINDAMYCIN PHOSPHATE 900 MG/50ML IV SOLN: 900.0000 mg | Freq: Three times a day (TID) | INTRAVENOUS | Status: DC

## 2021-06-20 MED ORDER — SIMETHICONE 80 MG PO CHEW: 80.0000 mg | CHEWABLE_TABLET | ORAL | Status: DC | PRN

## 2021-06-20 MED ORDER — OXYTOCIN 10 UNIT/ML IJ SOLN: 10.0000 [IU] | Freq: Once | INTRAMUSCULAR | Status: DC | PRN

## 2021-06-20 MED ORDER — LACTATED RINGERS IV BOLUS: 1000.0000 mL | Freq: Once | INTRAVENOUS | Status: DC

## 2021-06-20 MED ORDER — OXYTOCIN-SODIUM CHLORIDE 30-0.9 UT/500ML-% IV SOLN
INTRAVENOUS | Status: AC
  Filled 2021-06-20: qty 500

## 2021-06-20 MED ORDER — MISOPROSTOL 200 MCG PO TABS
ORAL_TABLET | ORAL | Status: AC
  Filled 2021-06-20: qty 2

## 2021-06-20 MED ORDER — METHYLERGONOVINE MALEATE 0.2 MG PO TABS
0.2000 mg | ORAL_TABLET | Freq: Four times a day (QID) | ORAL | Status: AC
  Administered 2021-06-20 – 2021-06-21 (×4): 0.2 mg via ORAL
  Filled 2021-06-20 (×4): qty 1

## 2021-06-20 MED ORDER — ONDANSETRON HCL 4 MG PO TABS: 4.0000 mg | ORAL_TABLET | ORAL | Status: DC | PRN

## 2021-06-20 MED ORDER — TRANEXAMIC ACID-NACL 1000-0.7 MG/100ML-% IV SOLN: 1000.0000 mg | Freq: Once | INTRAVENOUS | Status: AC | PRN

## 2021-06-20 MED ORDER — DIPHENHYDRAMINE HCL 25 MG PO CAPS: 25.0000 mg | ORAL_CAPSULE | Freq: Four times a day (QID) | ORAL | Status: DC | PRN

## 2021-06-20 MED ORDER — ACETAMINOPHEN 325 MG PO TABS: 650.0000 mg | ORAL_TABLET | ORAL | Status: DC | PRN

## 2021-06-20 MED ORDER — IBUPROFEN 600 MG PO TABS
600.0000 mg | ORAL_TABLET | Freq: Four times a day (QID) | ORAL | Status: DC
  Administered 2021-06-20 – 2021-06-21 (×6): 600 mg via ORAL
  Filled 2021-06-20 (×8): qty 1

## 2021-06-20 MED ORDER — OXYTOCIN-SODIUM CHLORIDE 30-0.9 UT/500ML-% IV SOLN: 10.0000 [IU]/h | INTRAVENOUS | Status: DC

## 2021-06-20 MED ORDER — MISOPROSTOL 200 MCG PO TABS: 800.0000 ug | ORAL_TABLET | Freq: Once | ORAL | Status: DC

## 2021-06-20 MED ORDER — SODIUM CHLORIDE 0.9 % IV SOLN
3.0000 g | Freq: Once | INTRAVENOUS | Status: AC
  Administered 2021-06-20: 3 g via INTRAVENOUS
  Filled 2021-06-20: qty 8

## 2021-06-20 MED ORDER — CARBOPROST TROMETHAMINE 250 MCG/ML IM SOLN: 250.0000 ug | INTRAMUSCULAR | Status: DC | PRN

## 2021-06-20 MED ORDER — METHYLERGONOVINE MALEATE 0.2 MG/ML IJ SOLN
INTRAMUSCULAR | Status: AC
  Filled 2021-06-20: qty 3

## 2021-06-20 MED ORDER — TRANEXAMIC ACID-NACL 1000-0.7 MG/100ML-% IV SOLN: 1000.0000 mg | INTRAVENOUS | Status: DC

## 2021-06-20 MED ORDER — CARBOPROST TROMETHAMINE 250 MCG/ML IM SOLN
INTRAMUSCULAR | Status: AC
  Filled 2021-06-20: qty 1

## 2021-06-20 MED ORDER — CARBOPROST TROMETHAMINE 250 MCG/ML IM SOLN
250.0000 ug | INTRAMUSCULAR | Status: DC | PRN
Start: 2021-06-20 — End: 2021-06-20

## 2021-06-20 MED ORDER — LACTATED RINGERS IV SOLN: INTRAVENOUS | Status: DC

## 2021-06-20 NOTE — Progress Notes (Signed)
[name] [name] 40 y.o. G2P1001 [redacted]w[redacted]d Admitted for PPROM on 06/12/21 PPROM on 06/08/21 Hospital day #8 IUFD dx on 06/19/21  S: Pt selected medical induction. Induction initiated and questions answered. Reports feeling lightheaded and dizzy during shower, seeing a scant amount of bright red blood, and needing to lie on the BR floor for recovery. Pt requests a repeat CBC. Asymptomatic currently, no change in VS.   O: Blood pressure (!) 109/55, pulse 89, temperature 98.1 F (36.7 C), temperature source Oral, resp. rate 18, height 5\' 4"  (1.626 m), weight 57.8 kg, SpO2 99 %.  A/P:  IUFD @ 17 wks  PPROM Medical induction    -Cytotec 400 mcg PV placed @ 2304 Anticipate SVD  2305, MSN, CNM 06/20/2021 12:32 AM

## 2021-06-20 NOTE — Procedures (Addendum)
SVD of previable, suspected female fetus, without a heart rate. Placenta remains adherent to the uterine wall approx 1 hr after delivery of fetus. EBL approx 1000 mL w/ large clots. IV infiltrated, pt will not allow RN to restart IV and demands IV team. Hypotension noted, maternal HR WNL, pt is diaphoretic and vomiting. Fluid volume replacement delayed as pt has multiple demands RE: person inserting, laterality, and placement of IV. IV placed, fluids infusing at bolus rate, TXA 1G IV, bleeding decreased to minimal, blood pressure increasing. Offered pain medication prior to manual removal of placenta and pt declined. Placenta removed, intact, and bleeding is scant. Optimal uterine involution noted via bimanual exam. Stat CBC and T&S, Unasyn 3G IV for uterine exploration, Methergine 0.2 mg PO Q 6 hrs x24 hrs, QBL 1763 mL. Dr. Connye Burkitt made of of procedure, interventions, and outcome. Plan of care developed w/ MD.   Pt dictated to spouse and friends to record audio and video of the entire procedure.   CBC Latest Ref Rng & Units 06/20/2021 06/20/2021 06/16/2021  WBC 4.0 - 10.5 K/uL 11.3(H) 8.9 10.4  Hemoglobin 12.0 - 15.0 g/dL 5.8(I) 10.5(L) 10.9(L)  Hematocrit 36.0 - 46.0 % 27.1(L) 29.7(L) 31.8(L)  Platelets 150 - 400 K/uL 248 183 163   Vitals with BMI 06/20/2021 06/20/2021 06/20/2021  Height - - -  Weight - - -  BMI - - -  Systolic 89 121 87  Diastolic 55 59 70  Pulse 107 108 100   Rhea Pink, MSN, [place] 06/20/2021 4:31 AM

## 2021-06-20 NOTE — Progress Notes (Addendum)
Post Partum Day 0 Subjective: Patient reports feeling dizzy after standing for a while.   Objective: Blood pressure 109/88, pulse 89, temperature 97.9 F (36.6 C), temperature source Oral, resp. rate 16, height 5\' 4"  (1.626 m), weight 57.8 kg, SpO2 100 %.  Physical Exam:  General: alert, fatigued, and pale Lochia: appropriate Uterine Fundus: firm Incision: None DVT Evaluation: Not applicable  Recent Labs    06/20/21 0348 06/20/21 1235  HGB 9.3* 7.6*  HCT 27.1* 22.1*   CBC    Component Value Date/Time   WBC 10.1 06/20/2021 1235   RBC 2.35 (L) 06/20/2021 1235   HGB 7.6 (L) 06/20/2021 1235   HGB 14.0 09/29/2013 0944   HCT 22.1 (L) 06/20/2021 1235   HCT 42.2 09/29/2013 0944   PLT 179 06/20/2021 1235   PLT 182 09/29/2013 0944   MCV 94.0 06/20/2021 1235   MCV 91.9 09/29/2013 0944   MCH 32.3 06/20/2021 1235   MCHC 34.4 06/20/2021 1235   RDW 13.6 06/20/2021 1235   RDW 13.2 09/29/2013 0944   LYMPHSABS 1.2 06/16/2021 1350   LYMPHSABS 2.0 09/29/2013 0944   MONOABS 0.9 06/16/2021 1350   MONOABS 0.5 09/29/2013 0944   EOSABS 0.0 06/16/2021 1350   EOSABS 0.2 09/29/2013 0944   BASOSABS 0.0 06/16/2021 1350   BASOSABS 0.0 09/29/2013 0944    Assessment/Plan: Postpartum day 0 after delivery of fetal demise at 17 weeks 3 days EGA, with symptomatic anemia from acute blood loss, postpartum hemorrhage,  - Condolences and encouragement offered and accepted.  - Management options of blood transfusion (most recommended) versus intravenous iron discussed with patient with detail to risks, benefits and alternatives of each including but not limited to risks of infection (through viruses, bacteria and parasites in donor blood), allergic, hemolytic and other immunologic transfusion reactions.  She expressed understanding of all this and desired to discuss this further with her husband before making a decision.  They may be interested in direct blood donation from her husband and we discussed  this cannot be done during current admission but may be pursued as outpatient if desired. - Continue with oral iron tablets.  -Will defer pursuing checking for fetal gender as they desire to take the fetus home for burial.   LOS: 8 days  10/01/2013 [facility] 06/20/2021, 8:30 PM

## 2021-06-20 NOTE — Progress Notes (Signed)
Per Dr. Sallye Ober, spoke with patient and significant other about blood transfusion options. Gave significant other contact information for [employer]. Stated the process of him being the donor would be approximately 1 week and all outpatient. Chaplain at bedside. Patient, husband, chaplain discussed risks/benefits of their options. Patient and husband to discuss on their own prior to a final decision. Patient states dizziness is decreasing. Dizzy when walking to bathroom. Patient requests CBC to be done in the am to see trends in hemoglobin values. RN to continue assessing needs and discussion regarding a blood transfusion.  Raelyn Ensign, RN

## 2021-06-20 NOTE — Progress Notes (Signed)
RN called to bedside around 0220 with pt stating she was bleeding. Midwife notified. I remained at bedside where fetus was delivered at 0230. Provider soon present. Placenta delivery followed at 0345.  Baby now at bedside with family.

## 2021-06-20 NOTE — Lactation Note (Signed)
Lactation Consultation Note  Patient Name: Darlen Gledhill Bibb [id number] Date: 06/20/2021   Age:40 y.o. [facility] received a request to talk with mother following a fetal demise. On arrival, Do not disturb on the door. LC talked with RN, Robb Matar, states mother resting for the evening and would prefer lactation to come in the morning.  Maternal Data    Feeding    LATCH Score                    Lactation Tools Discussed/Used    Interventions    Discharge    Consult Status      Falisa Lamora  Nicholson-Springer 06/20/2021, 10:05 PM

## 2021-06-20 NOTE — Progress Notes (Signed)
Spoke with patient about blood transfusion. Patient stated she is going to wait until tomorrow morning, after her cbc results, to make the decision.  Raelyn Ensign, RN

## 2021-06-21 LAB — CBC WITH DIFFERENTIAL/PLATELET
Abs Immature Granulocytes: 0.07 10*3/uL (ref 0.00–0.07)
Basophils Absolute: 0 10*3/uL (ref 0.0–0.1)
Basophils Relative: 0 %
Eosinophils Absolute: 0.1 10*3/uL (ref 0.0–0.5)
Eosinophils Relative: 1 %
HCT: 18.2 % — ABNORMAL LOW (ref 36.0–46.0)
Hemoglobin: 6.3 g/dL — CL (ref 12.0–15.0)
Immature Granulocytes: 1 %
Lymphocytes Relative: 29 %
Lymphs Abs: 1.7 10*3/uL (ref 0.7–4.0)
MCH: 33.2 pg (ref 26.0–34.0)
MCHC: 34.6 g/dL (ref 30.0–36.0)
MCV: 95.8 fL (ref 80.0–100.0)
Monocytes Absolute: 0.5 10*3/uL (ref 0.1–1.0)
Monocytes Relative: 9 %
Neutro Abs: 3.4 10*3/uL (ref 1.7–7.7)
Neutrophils Relative %: 60 %
Platelets: 128 10*3/uL — ABNORMAL LOW (ref 150–400)
RBC: 1.9 MIL/uL — ABNORMAL LOW (ref 3.87–5.11)
RDW: 14.5 % (ref 11.5–15.5)
WBC: 5.8 10*3/uL (ref 4.0–10.5)
nRBC: 0 % (ref 0.0–0.2)

## 2021-06-21 MED ORDER — POLYSACCHARIDE IRON COMPLEX 150 MG PO CAPS: 150.0000 mg | ORAL_CAPSULE | Freq: Every day | ORAL | 3 refills | Status: AC

## 2021-06-21 MED ORDER — LACTATED RINGERS IV BOLUS
500.0000 mL | Freq: Once | INTRAVENOUS | Status: AC
  Administered 2021-06-21: 500 mL via INTRAVENOUS

## 2021-06-21 MED ORDER — POLYSACCHARIDE IRON COMPLEX 150 MG PO CAPS: 150.0000 mg | ORAL_CAPSULE | Freq: Every day | ORAL | 3 refills | Status: DC

## 2021-06-21 MED ORDER — IBUPROFEN 600 MG PO TABS: 600.0000 mg | ORAL_TABLET | Freq: Four times a day (QID) | ORAL | 0 refills | Status: AC

## 2021-06-21 MED ORDER — IBUPROFEN 600 MG PO TABS: 600.0000 mg | ORAL_TABLET | Freq: Four times a day (QID) | ORAL | 0 refills | Status: DC

## 2021-06-21 NOTE — Lactation Note (Signed)
Lactation Consultation Note  Patient Name: Maryana Pittmon Bona NIDPO'E Date: 06/21/2021   Age:40 y.o.  LC noted a DO NOT DISTURB sign on door.  LC spoke with RN taking care of Mom.  LC provided a brochure called [employer] Milkdrops for RN to provide Mom with and inquire whether she would like a lactation consultation.      Judee Clara RN, IBCLC 06/21/2021, 1:31 PM

## 2021-06-21 NOTE — Progress Notes (Signed)
RN called to room with patient complaint of persistent swelling around 2am. RN took vitals with a BP of 97/48 and temperature of 98.2. RN gave patient ice pack. At 0330, RN called back into room as patient had sweat through her bedding and presented with facial edema. RN called Rhea Pink CNM and Dr. Richardson Dopp. Both providers in c/s at this time. Member of OR staff relayed vital signs and symptoms while on the phone. Providers gave verbal order for Bolus. RN began Bolus. RN rechecked vitals during bolus. BP 101/ff, temp 97.4. RN received call from Rhea Pink to discuss plan of care. Orders to take vitals following bolus, then return to Q4 with scheduled CBC at 0900 06/21/2021. Post-bolus BP 102/50. Fundal assessment completed. Fundus firm, midline, U/3, bleeding scant. Raelyn Ensign, RN

## 2021-06-21 NOTE — Discharge Summary (Signed)
Fetal demise SVD OB Discharge Summary  Patient Name: [name] [name] DOB: 08-04-1981 MRN: [id number]  Date of admission: 06/12/2021 Intrauterine pregnancy: [redacted]w[redacted]d   Admitting diagnosis: Preterm premature rupture of membranes (PPROM) delivered, current hospitalization [O42.919] Preterm premature rupture of membranes (PPROM) with unknown onset of labor [O42.919] Secondary diagnosis: Anemia d/t pp hemorrhage   Date of discharge: 06/21/2021    Discharge diagnosis:  Stillborn delivered      Prenatal history: G2P1001   EDC : Not found.  Prenatal care at [facility]  Primary provider : [facility] Prenatal course complicated by SROM, cord prolapse x 2  Prenatal Labs: ABO, Rh: --/--/O POS (07/22 0348) Antibody: NEG (07/22 0348)                                      Hospital course:  Onset of Labor With Vaginal Delivery      40 y.o. yo G2P1001 at [redacted]w[redacted]d was admitted for SROM with prolapse cord on 06/12/2021. Patient had a complicated labor course as follows: SVD of previable, suspected female fetus, without a heart rate. Placenta remains adherent to the uterine wall approx 1 hr after delivery of fetus. EBL approx 1000 mL w/ large clots.  TXA 1G IV. Placenta removed, intact, and bleeding is scant. Optimal uterine involution noted via bimanual exam. Stat CBC and T&S, Unasyn 3G IV for uterine exploration, Methergine 0.2 mg PO Q 6 hrs x24 hrs, QBL 1763 mL. Hgb at time of discharge 6.3. Asymptomatic at present time. On PO iron. Pt refused blood and IV iron. Pt requesting discharge.   She is ambulating, tolerating a regular diet, passing flatus, and urinating well. Patient is discharged home in stable condition on 06/21/21.  Complications: Hemorrhage>1047mL and Stillborn  Newborn Data: This patient has no babies on file. Disposition:morgue  Post partum procedures: Declined blood transfusion and IV iron. On PO iron.  Labs: Lab Results  Component Value Date   WBC 5.8 06/21/2021   HGB 6.3 (LL)  06/21/2021   HCT 18.2 (L) 06/21/2021   MCV 95.8 06/21/2021   PLT 128 (L) 06/21/2021   CMP Latest Ref Rng & Units 06/12/2021  Glucose 70 - 99 mg/dL 73  BUN 6 - 20 mg/dL 9  Creatinine 3.84 - 5.36 mg/dL 4.68  Sodium 032 - 122 mmol/L 130(L)  Potassium 3.5 - 5.1 mmol/L 3.7  Chloride 98 - 111 mmol/L 99  CO2 22 - 32 mmol/L 21(L)  Calcium 8.9 - 10.3 mg/dL 8.9  Total Protein 6.5 - 8.1 g/dL 7.2  Total Bilirubin 0.3 - 1.2 mg/dL 0.4  Alkaline Phos 38 - 126 U/L 106  AST 15 - 41 U/L 46(H)  ALT 0 - 44 U/L 52(H)    Physical Exam @ time of discharge:  Vitals:   06/21/21 0739 06/21/21 1217 06/21/21 1637 06/21/21 1952  BP: (!) 100/53 (!) 124/59 126/68 111/66  Pulse: 90 86 92 93  Resp: 16 16 16 17   Temp: 97.8 F (36.6 C) 97.6 F (36.4 C) 97.8 F (36.6 C) 97.7 F (36.5 C)  TempSrc: Oral Oral Oral Oral  SpO2: 100% 100% 100% 100%  Weight:      Height:       general: alert, cooperative, and no distress lochia: appropriate uterine fundus: firm perineum: intact incision: N/A extremities: DVT Evaluation: No evidence of DVT seen on physical exam.  Discharge instructions: S/S of Anemia, continue po iron,  follow up with [facility] within 1 week. Discharge Medications:  Allergies as of 06/21/2021   No Known Allergies      Medication List     STOP taking these medications    Cranberry 500 MG Caps   D-MANNOSE PO   JUICE PLUS FIBRE PO   Miconazole Powd   NON FORMULARY   VITAMIN C PO   VITAMIN D PO       TAKE these medications    ibuprofen 600 MG tablet Commonly known as: ADVIL Take 1 tablet (600 mg total) by mouth every 6 (six) hours. Start taking on: June 22, 2021   iron polysaccharides 150 MG capsule Commonly known as: NIFEREX Take 1 capsule (150 mg total) by mouth daily. Start taking on: June 22, 2021       Diet: routine diet and iron rich diet Activity: Advance as tolerated. Pelvic rest x 6 weeks.  Follow up:1 week  Signed: Carollee Leitz MSN,  CNM 06/21/2021, 9:09 PM

## 2021-06-21 NOTE — Progress Notes (Signed)
Date and time results received: 06/21/21 0940  (use smartphrase ".now" to insert current time)  Test: Hgb Critical Value: 6.3  Name of Provider Notified: Aldine Contes CNM  Orders Received? Or Actions Taken?:  CNM to see patient at lunchtime to discuss options.

## 2021-06-21 NOTE — Progress Notes (Signed)
Post Partum Day 1  Subjective: Pt reports being able to ambulate to bathroom and in room without being dizzy. Denies nausea. Appetite has returned.  Objective:  BP (!) 124/59 (BP Location: Right Arm)   Pulse 86   Temp 97.6 F (36.4 C) (Oral)   Resp 16   Ht 5\' 4"  (1.626 m)   Wt 57.8 kg   SpO2 100%   BMI 21.87 kg/m    Physical Exam:  General: alert, NAD, pallor Lochia: appropriate, minimal Uterine Fundus: firm Incision: None DVT Evaluation: negative Generalized edema in face and lower extremities, non pitting  CBC    Component Value Date/Time   WBC 5.8 06/21/2021 0900   RBC 1.90 (L) 06/21/2021 0900   HGB 6.3 (LL) 06/21/2021 0900   HGB 14.0 09/29/2013 0944   HCT 18.2 (L) 06/21/2021 0900   HCT 42.2 09/29/2013 0944   PLT 128 (L) 06/21/2021 0900   PLT 182 09/29/2013 0944   MCV 95.8 06/21/2021 0900   MCV 91.9 09/29/2013 0944   MCH 33.2 06/21/2021 0900   MCHC 34.6 06/21/2021 0900   RDW 14.5 06/21/2021 0900   RDW 13.2 09/29/2013 0944   LYMPHSABS 1.7 06/21/2021 0900   LYMPHSABS 2.0 09/29/2013 0944   MONOABS 0.5 06/21/2021 0900   MONOABS 0.5 09/29/2013 0944   EOSABS 0.1 06/21/2021 0900   EOSABS 0.2 09/29/2013 0944   BASOSABS 0.0 06/21/2021 0900   BASOSABS 0.0 09/29/2013 0944    Assessment/Plan: Postpartum day 1 after delivery of fetal demise at 17 weeks 3 days EGA, with symptomatic anemia from acute blood loss, postpartum hemorrhage. Refused blood transfusion. Now asymptomatic   - Condolences given - Management : Pt and husband request direct blood transfusion from him. Pt has contacted [facility] to initiated the process. Requesting to stay in hospital until blood transfusion.  Dr 10/01/2013 into discuss POC and management with pt.  - Continue with oral iron tablets. -Will defer pursuing checking for fetal gender as they desire to take the fetus home for burial.   LOS: 9 days

## 2021-06-21 NOTE — Lactation Note (Signed)
Lactation Consultation Note  Patient Name: [name] [name] [name] [name] Date: 06/21/2021 Reason for consult: Initial assessment;Other (Comment) (IUFD) Age:40 y.o.  LC in to room per patient's request. Patient is about to be discharged and has questions regarding managing/stopping milk productions after a loss. Talked about wearing comfortable supportive bras. Encouraged cold packs every 15 mins at a time as needed for comfort. Discussed expressing just a little breast milk to relieve pain if breasts become over filled. Reviewed how to use warm water bath to help relieve pain and applying cabbage leaves.  LC provided brochure with information and answered questions/concerns. Lactation services number provided for additional support or questions.   Interventions Interventions: Education;Ice  Consult Status Consult Status: Complete    Carder Yin A Higuera Ancidey 06/21/2021, 9:38 PM

## 2021-06-23 LAB — SURGICAL PATHOLOGY

## 2024-07-25 ENCOUNTER — Encounter (INDEPENDENT_AMBULATORY_CARE_PROVIDER_SITE_OTHER): Payer: Self-pay | Admitting: Otolaryngology

## 2024-07-25 ENCOUNTER — Ambulatory Visit (INDEPENDENT_AMBULATORY_CARE_PROVIDER_SITE_OTHER): Admitting: Otolaryngology

## 2024-07-25 VITALS — BP 101/68 | HR 72 | Ht 64.0 in | Wt 120.0 lb

## 2024-07-25 DIAGNOSIS — H6983 Other specified disorders of Eustachian tube, bilateral: Secondary | ICD-10-CM

## 2024-07-25 DIAGNOSIS — H6980 Other specified disorders of Eustachian tube, unspecified ear: Secondary | ICD-10-CM | POA: Diagnosis not present

## 2024-07-25 DIAGNOSIS — H9312 Tinnitus, left ear: Secondary | ICD-10-CM

## 2024-07-25 DIAGNOSIS — H9042 Sensorineural hearing loss, unilateral, left ear, with unrestricted hearing on the contralateral side: Secondary | ICD-10-CM | POA: Diagnosis not present

## 2024-07-26 DIAGNOSIS — H9312 Tinnitus, left ear: Secondary | ICD-10-CM | POA: Insufficient documentation

## 2024-07-26 DIAGNOSIS — H6983 Other specified disorders of Eustachian tube, bilateral: Secondary | ICD-10-CM | POA: Insufficient documentation

## 2024-07-26 DIAGNOSIS — H9042 Sensorineural hearing loss, unilateral, left ear, with unrestricted hearing on the contralateral side: Secondary | ICD-10-CM | POA: Insufficient documentation

## 2024-07-26 NOTE — Progress Notes (Signed)
 CC: Left ear tinnitus, left ear hearing loss  HPI:  [name] [name] is a 43 y.o. female who presents today for evaluation of her left ear tinnitus and left ear hearing loss.  According to the patient, she first noted her left ear tinnitus 2 to 3 weeks ago.  She describes the tinnitus as a high-pitched ringing noise.  She also complains of occasional clogging sensation in her ears.  She was recently evaluated at [facility], and was noted to have mild asymmetric left ear sensorineural hearing loss.  The patient has no previous otologic surgery.  She denies any significant otalgia, otorrhea, or vertigo.  She denies any significant hearing difficulty.  Past Medical History:  Diagnosis Date   Capillary fragility (HCC) 2013   Hives 2013   Infection    HX FREQUENT UTI'S   Schamberg's disease 06/2012    Past Surgical History:  Procedure Laterality Date   WISDOM TOOTH EXTRACTION  2002    Family History  Problem Relation Age of Onset   Depression Mother    Hyperlipidemia Mother    Hypertension Mother    Hyperlipidemia Father    Hypertension Father    Cancer Maternal Grandmother        BREAST   Stroke Maternal Grandmother    Dementia Maternal Grandmother    Diabetes Maternal Grandmother    Cancer Maternal Grandfather    Heart disease Maternal Grandfather    Down syndrome Other    Cancer Other 28       BREAST    Social History:  reports that she has never smoked. She has never used smokeless tobacco. She reports that she does not drink alcohol and does not use drugs.  Allergies: No Known Allergies  Prior to Admission medications   Medication Sig Start Date End Date Taking? Authorizing Provider  ibuprofen  (ADVIL ) 600 MG tablet Take 1 tablet (600 mg total) by mouth every 6 (six) hours. 06/22/21  Yes Holshouser, Suzen NOVAK, CNM  iron  polysaccharides (NIFEREX) 150 MG capsule Take 1 capsule (150 mg total) by mouth daily. 06/22/21  Yes Holshouser, Suzen NOVAK, CNM     Blood pressure 101/68, pulse 72, height 5' 4 (1.626 m), weight 120 lb (54.4 kg), SpO2 97%, unknown if currently breastfeeding. Exam: General: Communicates without difficulty, well nourished, no acute distress. Head: Normocephalic, no evidence injury, no tenderness, facial buttresses intact without stepoff. Face/sinus: No tenderness to palpation and percussion. Facial movement is normal and symmetric. Eyes: PERRL, EOMI. No scleral icterus, conjunctivae clear. Neuro: CN II exam reveals vision grossly intact.  No nystagmus at any point of gaze. Ears: Auricles well formed without lesions.  Ear canals are intact without mass or lesion.  No erythema or edema is appreciated.  The TMs are intact without fluid. Nose: External evaluation reveals normal support and skin without lesions.  Dorsum is intact.  Anterior rhinoscopy reveals normal mucosa over anterior aspect of inferior turbinates and intact septum.  No purulence noted. Oral:  Oral cavity and oropharynx are intact, symmetric, without erythema or edema.  Mucosa is moist without lesions. Neck: Full range of motion without pain.  There is no significant lymphadenopathy.  No masses palpable.  Thyroid bed within normal limits to palpation.  Parotid glands and submandibular glands equal bilaterally without mass.  Trachea is midline. Neuro:  CN 2-12 grossly intact.   Assessment: 1.  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 2.  Her left ear tinnitus is likely a direct result of the hearing  loss. 3.  Clinical eustachian tube dysfunction.  Plan: 1.  The physical exam findings and the hearing test results are reviewed with the patient. 2.  The small possibility of a retrocochlear lesion causing the asymmetric hearing loss is discussed.  The options of conservative observation versus MRI scan are reviewed.  The patient would like to proceed with conservative observation.  3.  Flonase nasal spray 2 sprays each nostril daily. 4.  The patient will  return for reevaluation in 6 months, with repeat hearing test.  Nello Corro W Jed Kutch 07/26/2024, 10:09 AM

## 2025-01-25 ENCOUNTER — Ambulatory Visit (INDEPENDENT_AMBULATORY_CARE_PROVIDER_SITE_OTHER): Admitting: Audiology

## 2025-01-25 ENCOUNTER — Ambulatory Visit (INDEPENDENT_AMBULATORY_CARE_PROVIDER_SITE_OTHER): Admitting: Otolaryngology
